# Patient Record
Sex: Male | Born: 1950 | Race: White | Hispanic: No | Marital: Married | Smoking: Current every day smoker
Health system: Southern US, Community
[De-identification: ages and names within clinical notes are randomized; demographics above are authoritative.]

## PROBLEM LIST (undated history)

## (undated) DIAGNOSIS — R112 Nausea with vomiting, unspecified: Secondary | ICD-10-CM

## (undated) DIAGNOSIS — N411 Chronic prostatitis: Secondary | ICD-10-CM

## (undated) DIAGNOSIS — Z87442 Personal history of urinary calculi: Secondary | ICD-10-CM

## (undated) DIAGNOSIS — Z9889 Other specified postprocedural states: Secondary | ICD-10-CM

## (undated) DIAGNOSIS — M1990 Unspecified osteoarthritis, unspecified site: Secondary | ICD-10-CM

## (undated) DIAGNOSIS — K219 Gastro-esophageal reflux disease without esophagitis: Secondary | ICD-10-CM

## (undated) HISTORY — PX: KNEE ARTHROSCOPY: SUR90

## (undated) HISTORY — PX: COLONOSCOPY: SHX174

## (undated) HISTORY — PX: NOSE SURGERY: SHX723

## (undated) HISTORY — PX: EYE SURGERY: SHX253

## (undated) HISTORY — PX: HERNIA REPAIR: SHX51

---

## 2005-09-22 ENCOUNTER — Ambulatory Visit: Payer: Self-pay | Admitting: Gastroenterology

## 2008-04-23 ENCOUNTER — Ambulatory Visit: Payer: Self-pay | Admitting: Family Medicine

## 2008-12-15 ENCOUNTER — Ambulatory Visit: Payer: Self-pay | Admitting: Unknown Physician Specialty

## 2009-04-14 ENCOUNTER — Ambulatory Visit: Payer: Self-pay | Admitting: Orthopedic Surgery

## 2009-04-21 ENCOUNTER — Ambulatory Visit: Payer: Self-pay | Admitting: Orthopedic Surgery

## 2009-04-28 ENCOUNTER — Encounter: Payer: Self-pay | Admitting: Orthopedic Surgery

## 2009-04-29 ENCOUNTER — Encounter: Payer: Self-pay | Admitting: Orthopedic Surgery

## 2010-02-08 ENCOUNTER — Ambulatory Visit: Payer: Self-pay | Admitting: Unknown Physician Specialty

## 2011-01-19 ENCOUNTER — Ambulatory Visit: Payer: Self-pay | Admitting: Urology

## 2011-01-27 ENCOUNTER — Ambulatory Visit: Payer: Self-pay | Admitting: Urology

## 2012-01-30 ENCOUNTER — Ambulatory Visit: Payer: Self-pay

## 2012-02-25 ENCOUNTER — Emergency Department: Payer: Self-pay | Admitting: Emergency Medicine

## 2012-02-25 LAB — APTT: Activated PTT: 29 secs (ref 23.6–35.9)

## 2012-02-25 LAB — COMPREHENSIVE METABOLIC PANEL
Albumin: 4.3 g/dL (ref 3.4–5.0)
Alkaline Phosphatase: 146 U/L — ABNORMAL HIGH (ref 50–136)
Anion Gap: 11 (ref 7–16)
BUN: 15 mg/dL (ref 7–18)
Bilirubin,Total: 1.2 mg/dL — ABNORMAL HIGH (ref 0.2–1.0)
Chloride: 103 mmol/L (ref 98–107)
Co2: 23 mmol/L (ref 21–32)
EGFR (African American): >60
EGFR (Non-African Amer.): >60
Glucose: 128 mg/dL — ABNORMAL HIGH (ref 65–99)
SGPT (ALT): 36 U/L
Sodium: 137 mmol/L (ref 136–145)
Total Protein: 7.5 g/dL (ref 6.4–8.2)

## 2012-02-25 LAB — PROTIME-INR
INR: 1.1
Prothrombin Time: 15 secs — ABNORMAL HIGH (ref 11.5–14.7)

## 2012-02-25 LAB — CBC
HCT: 44.6 % (ref 40.0–52.0)
HGB: 15.3 g/dL (ref 13.0–18.0)
MCHC: 34.4 g/dL (ref 32.0–36.0)
MCV: 100 fL (ref 80–100)
Platelet: 187 10*3/uL (ref 150–440)

## 2012-02-25 LAB — DIFFERENTIAL
Basophil #: 0.3 10*3/uL — ABNORMAL HIGH (ref 0.0–0.1)
Basophil %: 1.1 %
Eosinophil #: 0 10*3/uL (ref 0.0–0.7)
Eosinophil %: 0.1 %
Lymphocyte #: 1.8 10*3/uL (ref 1.0–3.6)
Monocyte #: 0.9 x10 3/mm (ref 0.2–1.0)
Neutrophil #: 25.5 10*3/uL — ABNORMAL HIGH (ref 1.4–6.5)
Neutrophil %: 89.3 %

## 2013-03-07 ENCOUNTER — Ambulatory Visit: Payer: Self-pay | Admitting: Unknown Physician Specialty

## 2013-03-11 LAB — PATHOLOGY REPORT

## 2013-06-19 IMAGING — CT CT THORACIC SPINE WITHOUT CONTRAST
1 series · 12 of 14 positions shown, 15 images · non-contrast
Comparison: none

REASON FOR EXAM: fall
COMMENTS:

PROCEDURE:     CT  - CT THORACIC SPINE WO  - February 25, 2012  [DATE]
RESULT:     Comparison: Chest radiograph performed same day as well as
04/23/2008
TECHNIQUE: Multiple axial images were obtained of the thoracic spine,
without intravenous contrast.

[Series 3: axials · axial · 0.29mm/px · z∈[-228,+58]mm · 12 of 113 slices shown, 15 images]
[im 9/113  soft-tissue]
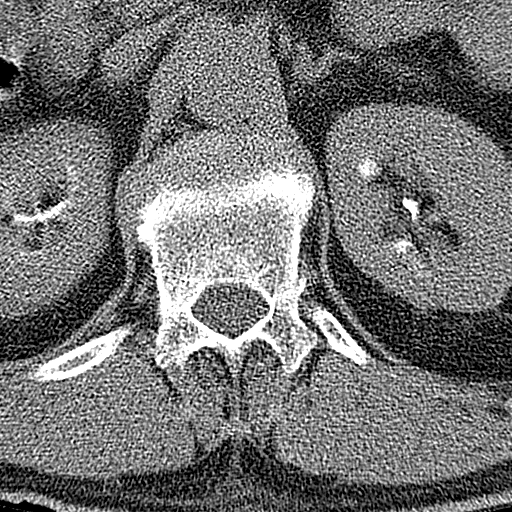
[im 9/113  bone]
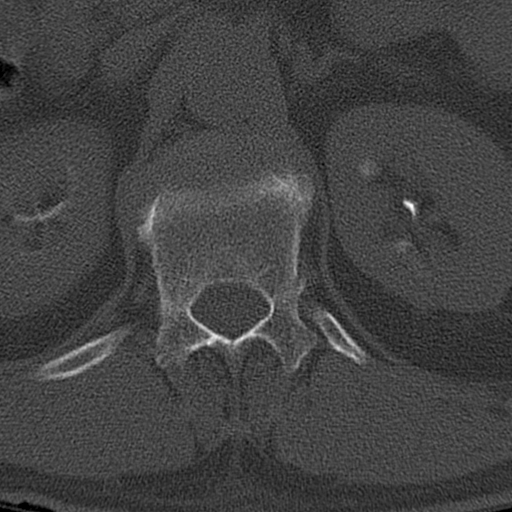
[im 18/113  bone]
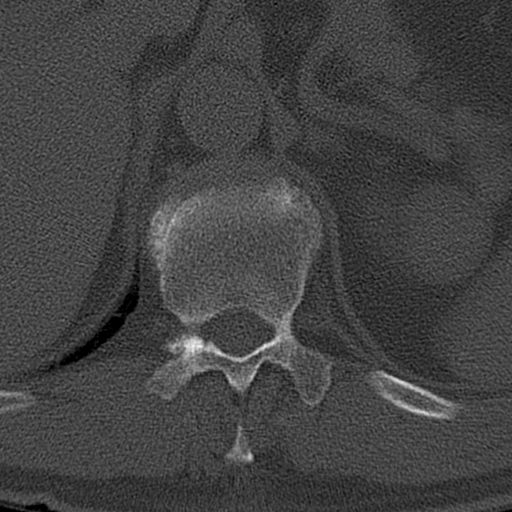
[im 26/113  bone]
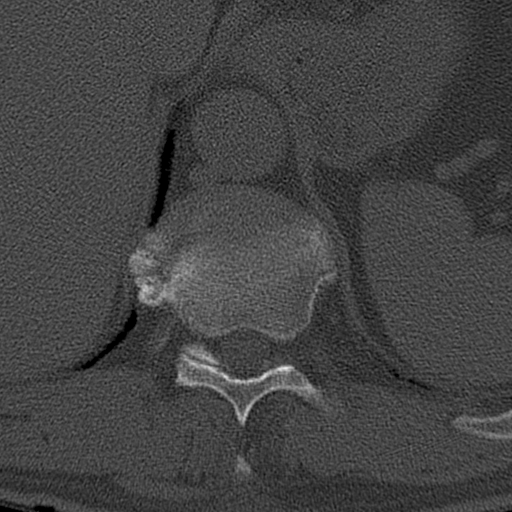
[im 35/113  bone]
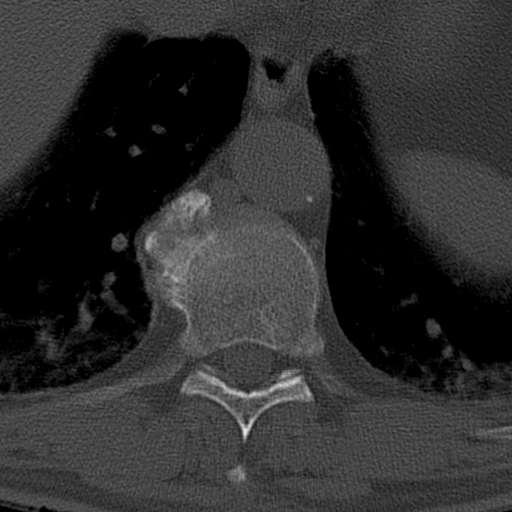
[im 44/113  soft-tissue]
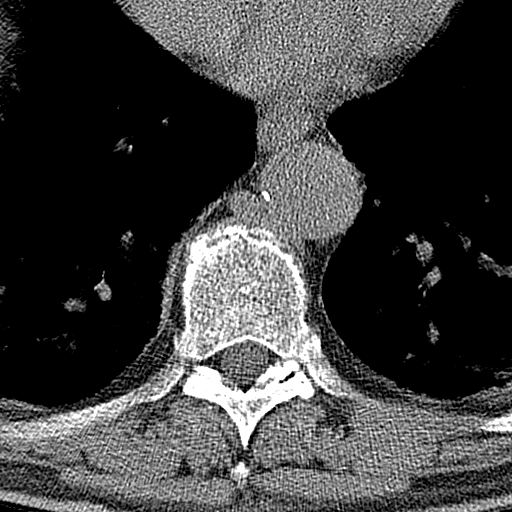
[im 44/113  bone]
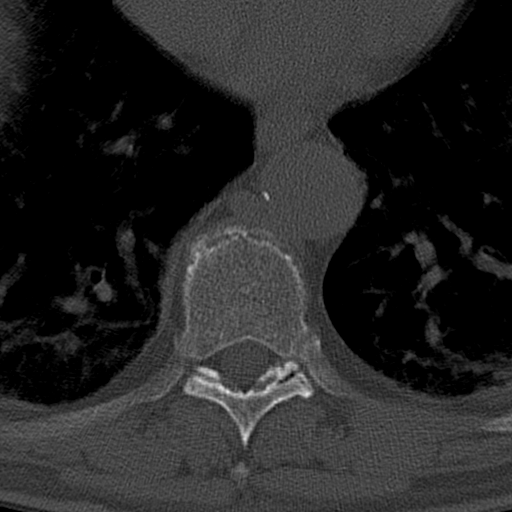
[im 52/113  bone]
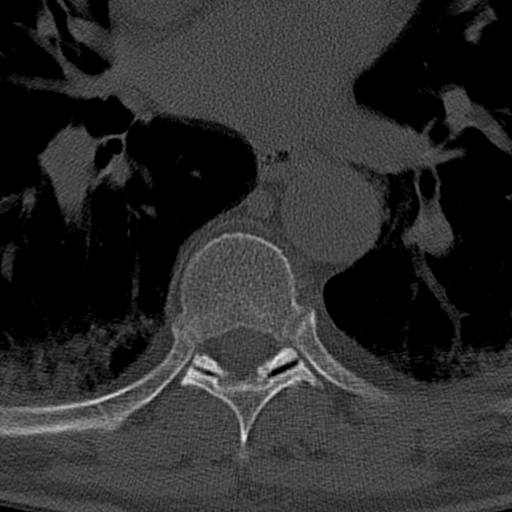
[im 61/113  bone]
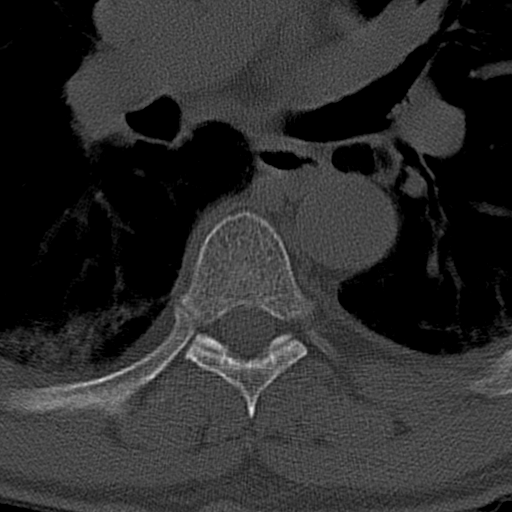
[im 69/113  bone]
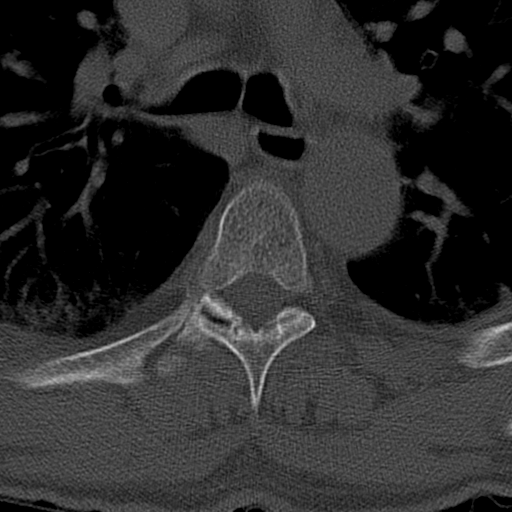
[im 78/113  soft-tissue]
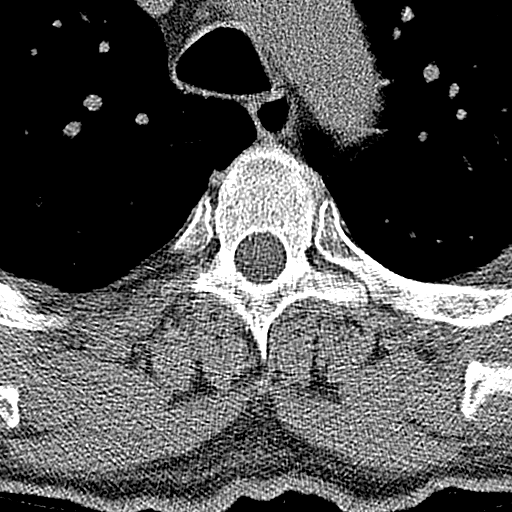
[im 78/113  bone]
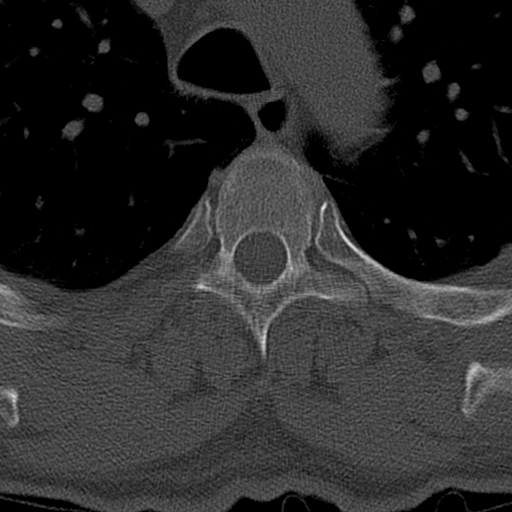
[im 87/113  bone]
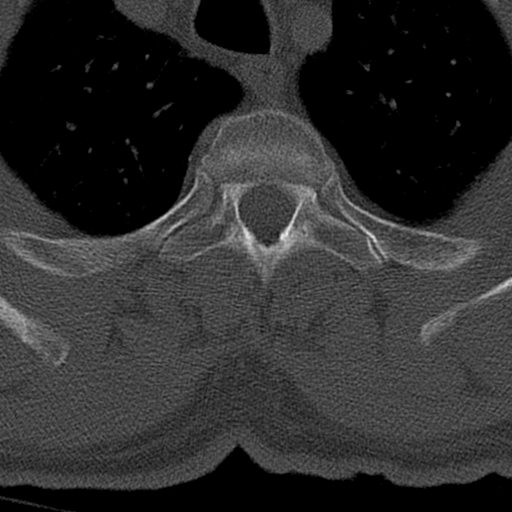
[im 95/113  bone]
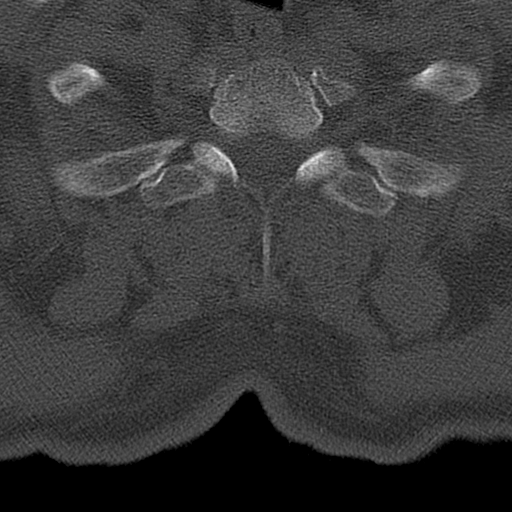
[im 104/113  bone]
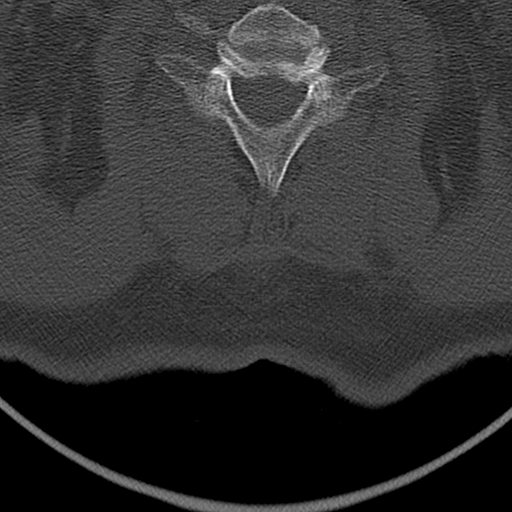

[12 of 14 positions shown; findings below may reference images not displayed]

FINDINGS: There is a mild to moderate anterior wedge compression deformity of the T12
vertebral body. While this is technically age indeterminate, there is some
linear density just inferior to the superior endplate as well as some
lucency in the anterior osteophyte. In comparing the chest radiograph of
today to 04/23/2008 this height loss may be slightly increased from prior.
Findings raise the possibility of an acute on chronic fracture. Mild
anterior height loss of the T8, T9, and T10 vertebral bodies appears
chronic, and similar to the prior chest radiograph in 9110.

Mild opacities in the dependent lower lobes are likely secondary to
atelectasis. Infection or aspiration are not excluded.
IMPRESSION: 1. Mild to moderate anterior wedge compression deformity of the T12
vertebral body is technically age indeterminate. However, there are findings
that suggest there is an acute on chronic fracture of this vertebral body.
Correlate with patient's history and site of pain.
2. Mild anterior height loss of several other vertebral bodies in the lower
thoracic spine appear chronic and similar to the prior chest radiograph,
given differences in technique.

[REDACTED]

## 2013-06-19 IMAGING — CT CT MAXILLOFACIAL WITHOUT CONTRAST
1 series · 15 of 30 positions shown, 19 images · non-contrast
Comparison: none

REASON FOR EXAM: s/p fall
COMMENTS:

PROCEDURE:     CT  - CT MAXILLOFACIAL AREA WO  - February 25, 2012  [DATE]
RESULT:     Comparison: None.
TECHNIQUE: Multiple axial images were obtained of the face, without
intravenous contrast. Coronal reformats were performed.

[Series 2: facial 3.0 h60f · axial · 0.34mm/px · z∈[-94,+68]mm · 15 of 60 slices shown, 19 images]
[im 3/60  brain]
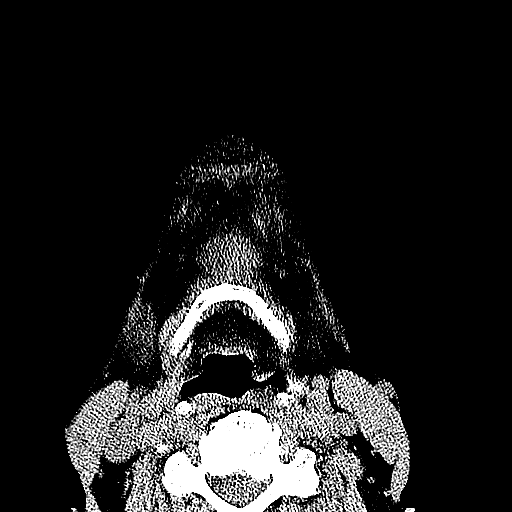
[im 3/60  bone]
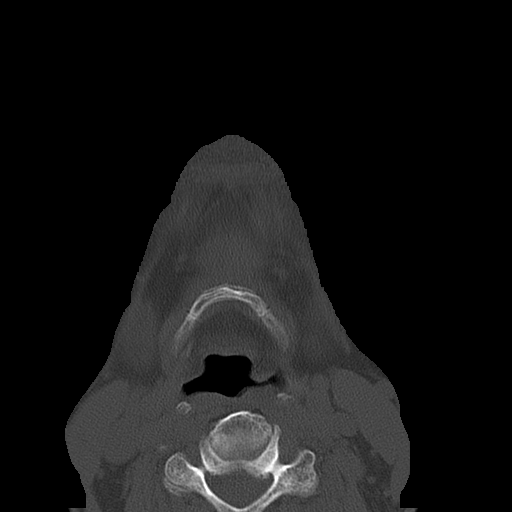
[im 7/60  bone]
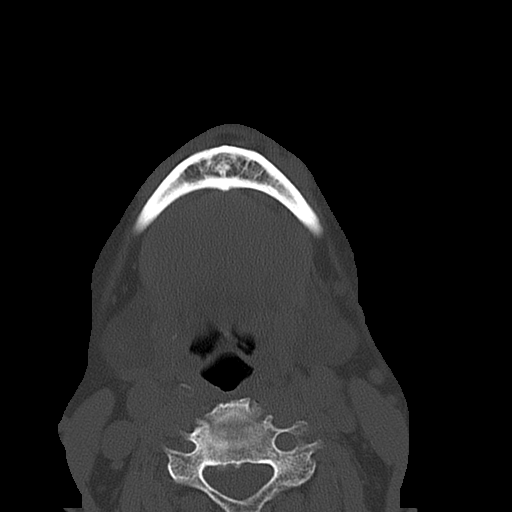
[im 11/60  bone]
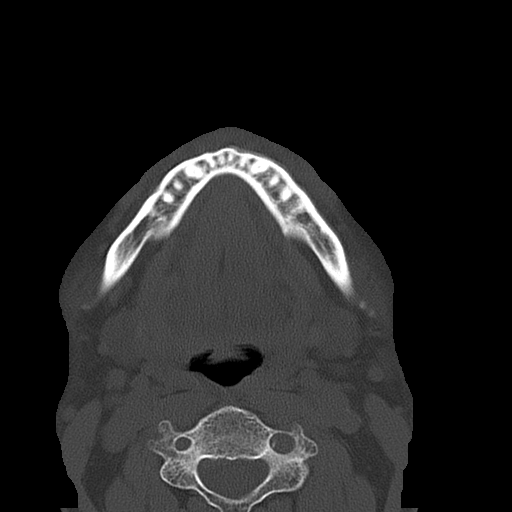
[im 15/60  bone]
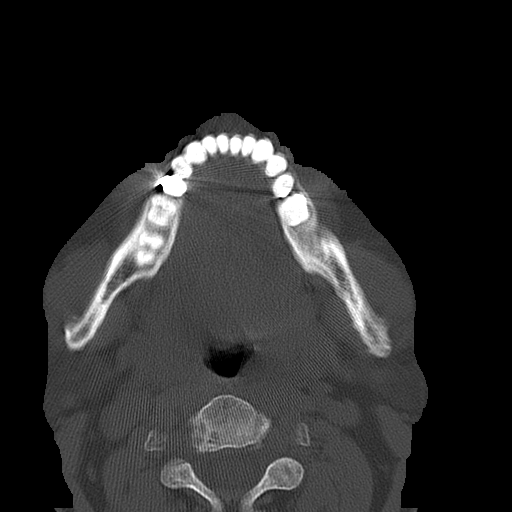
[im 19/60  brain]
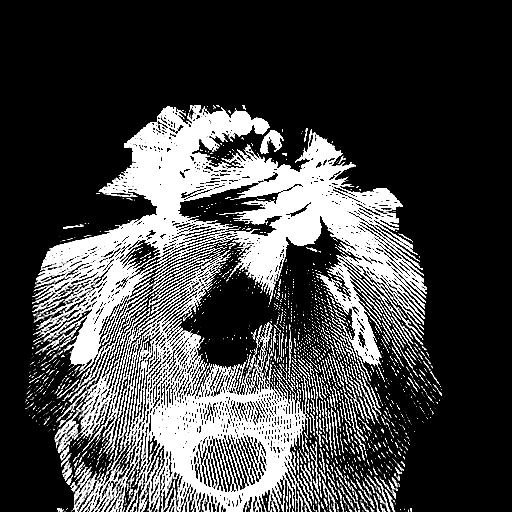
[im 19/60  bone]
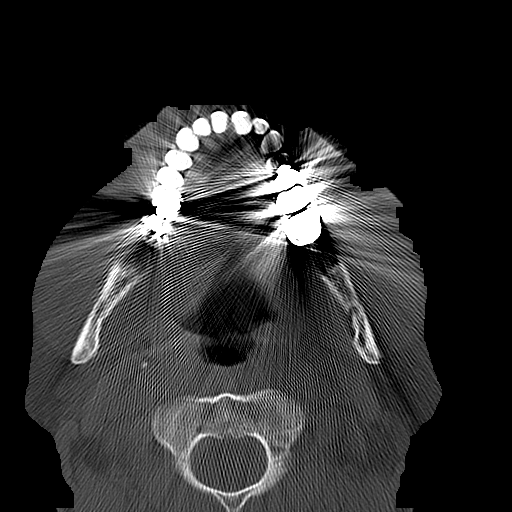
[im 23/60  bone]
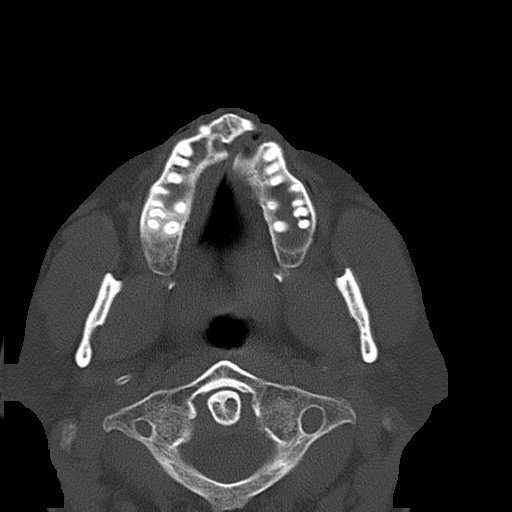
[im 27/60  bone]
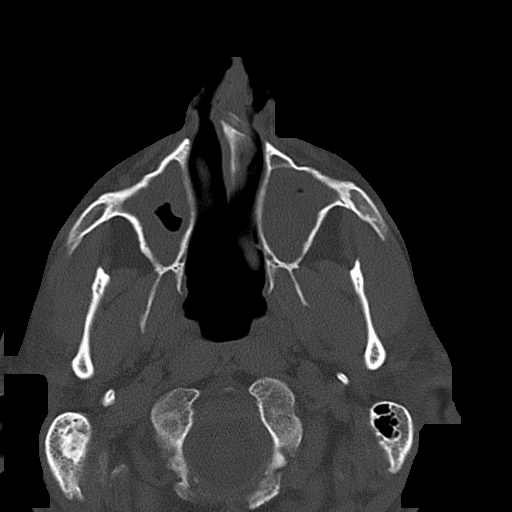
[im 31/60  bone]
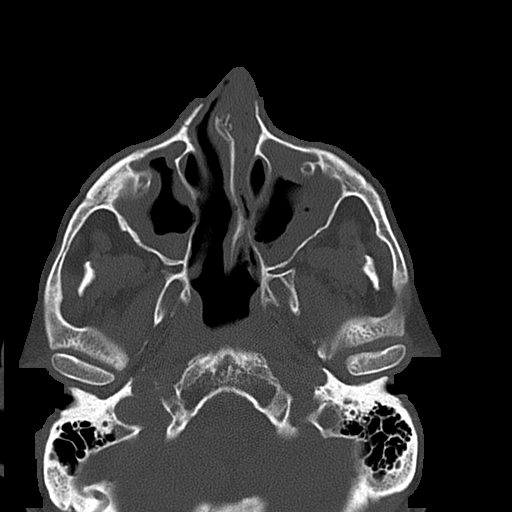
[im 33/60  brain]
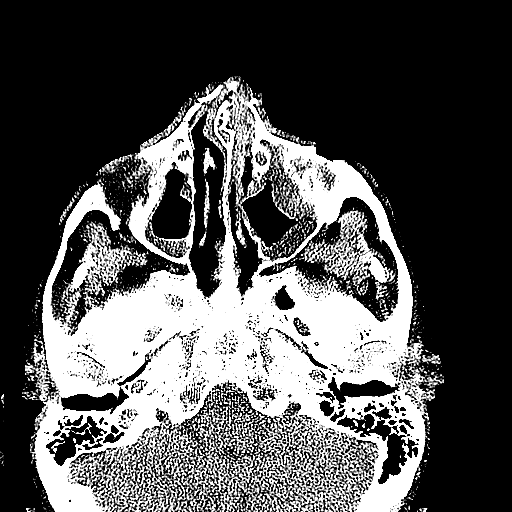
[im 33/60  bone]
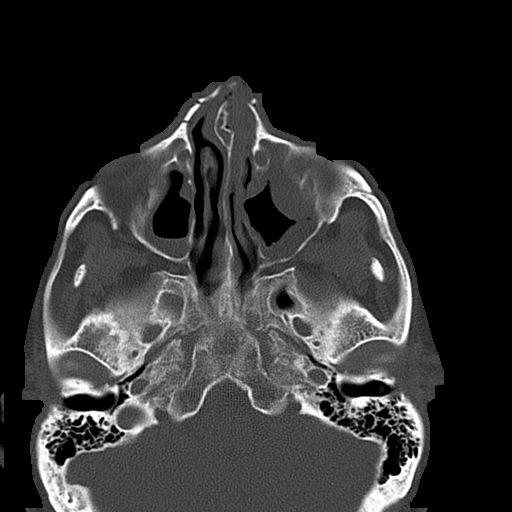
[im 37/60  bone]
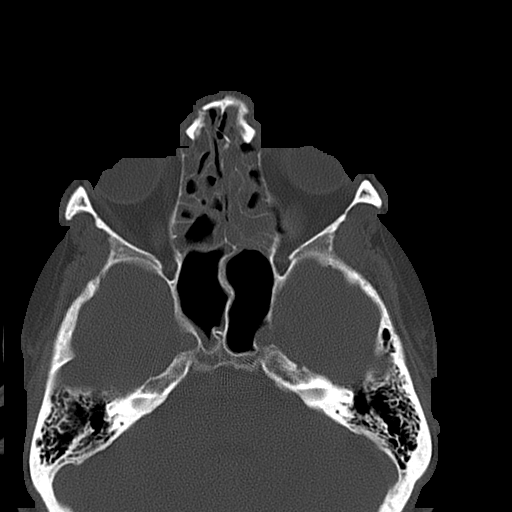
[im 41/60  bone]
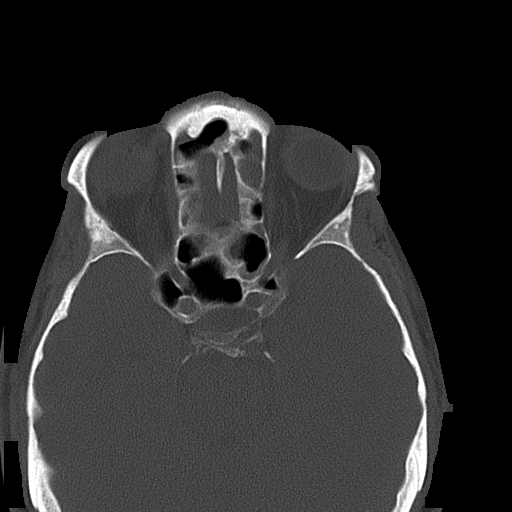
[im 45/60  bone]
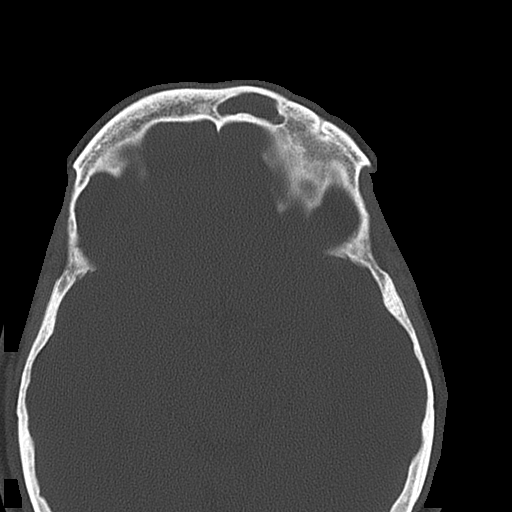
[im 49/60  brain]
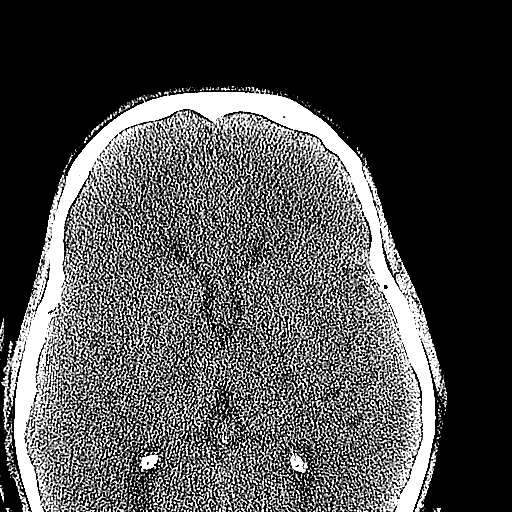
[im 49/60  bone]
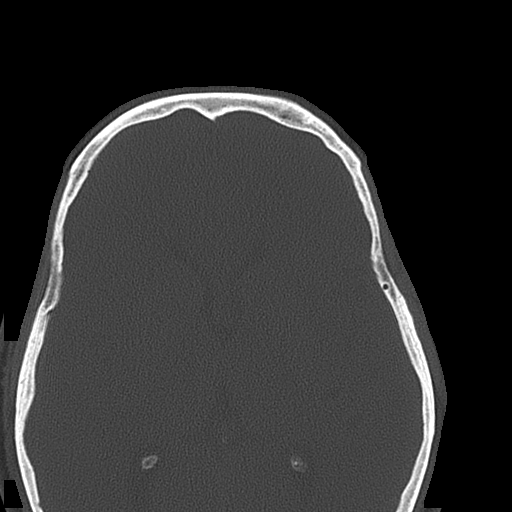
[im 53/60  bone]
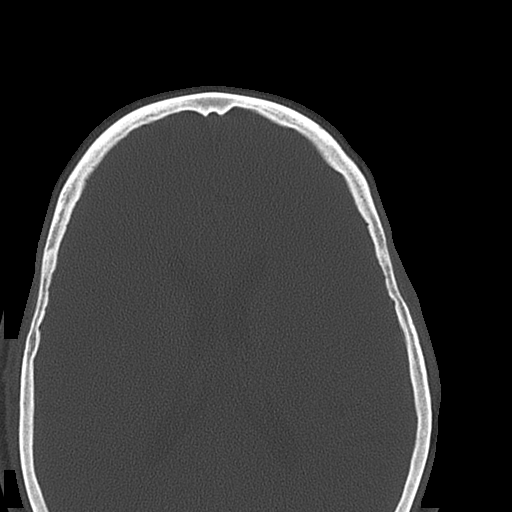
[im 57/60  bone]
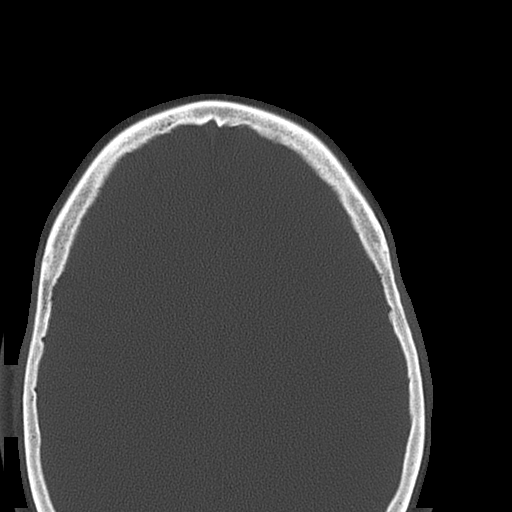

[15 of 30 positions shown; findings below may reference images not displayed]

FINDINGS: There are multiple displaced fractures of the nasal bones. There is
displaced fracture of the nasal septum. There is medial buckling of the
medial walls of the bilateral maxillary sinuses, which may represent
fractures. There is a fracture of the left anterior maxilla, which extends
into the hard palate. There is moderate opacification of the ethmoid air
cells, maxillary sinuses, and frontal sinuses. The globes are intact. There
is mild mucosal thickening of the right sphenoid sinus.

Flattening of the mandibular condyles is likely secondary to degenerative
change.
IMPRESSION: 1. Comminuted fractures of the nasal bones as well as fracture of the nasal
septum. There is a fracture of the left anterior maxilla, which extends into
the hard palate.
2. Mild medial buckling of the medial walls of the maxillary sinuses may be
secondary to fractures.

[REDACTED]

## 2013-06-19 IMAGING — CT CT ABDOMEN W/ CM
1 of 2 series · 14 of 32 positions shown, 19 images · IV contrast (isovue)
Comparison: none

REASON FOR EXAM: RUQ paIN BLUNT TRAUMA
COMMENTS:

PROCEDURE:     CT  - CT ABDOMEN STANDARD W  - February 25, 2012  [DATE]
RESULT:     Comparison: None
TECHNIQUE: Multiple axial images of the abdomen were performed from the lung
bases to the iliac crests, without p.o. contrast and with 100 mL of Isovue
300 intravenous contrast.

[Series 2: 3mm soft tissue · axial · 0.68mm/px · z∈[-373,-49]mm · 14 of 120 slices shown, 19 images]
[im 6/120  soft-tissue]
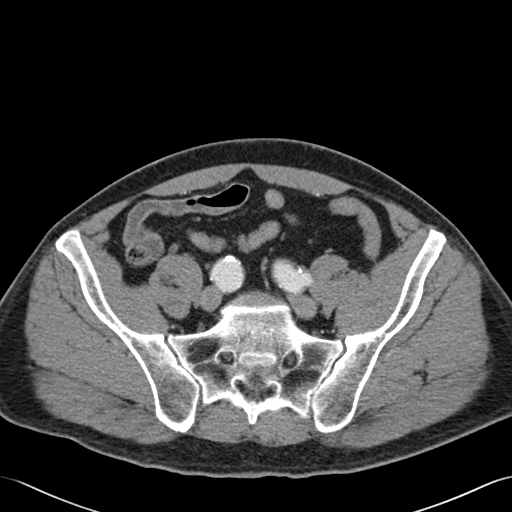
[im 6/120  bone]
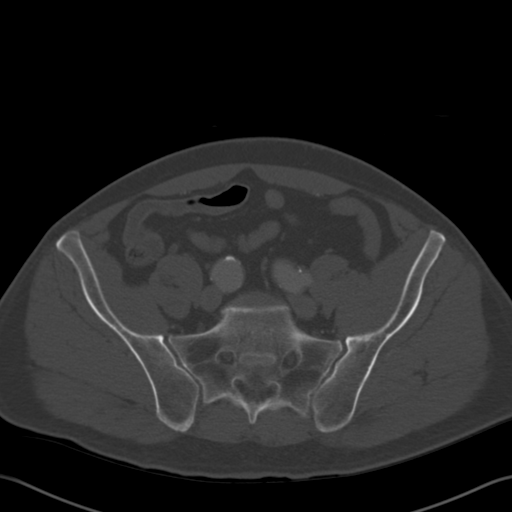
[im 18/120  soft-tissue]
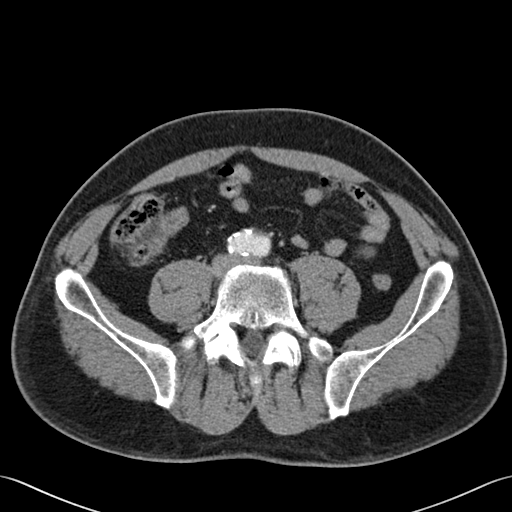
[im 23/120  soft-tissue]
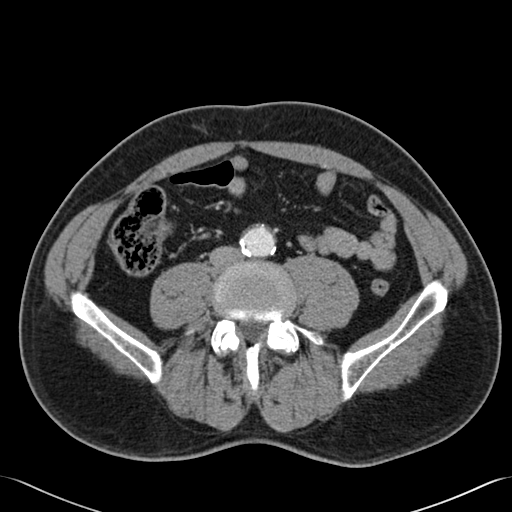
[im 35/120  soft-tissue]
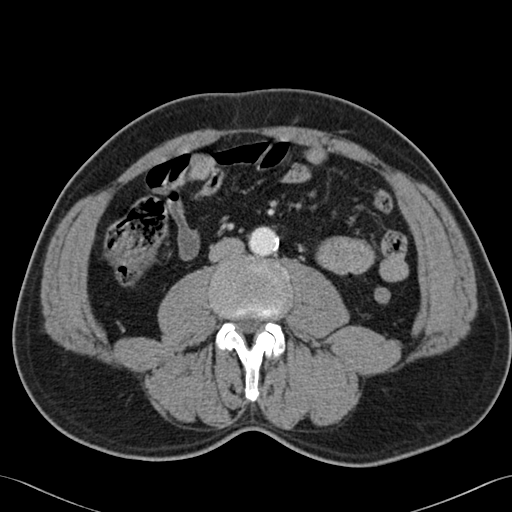
[im 40/120  soft-tissue]
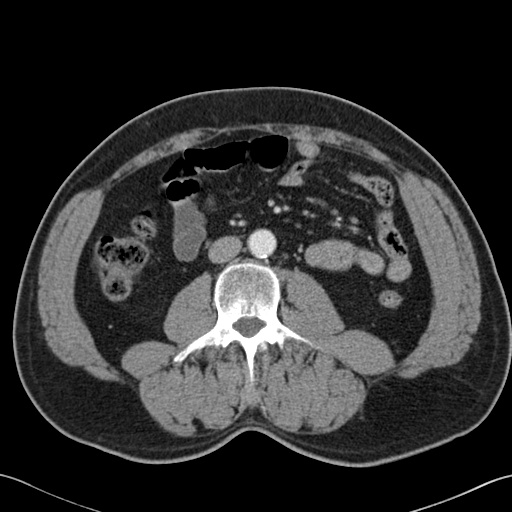
[im 52/120  soft-tissue]
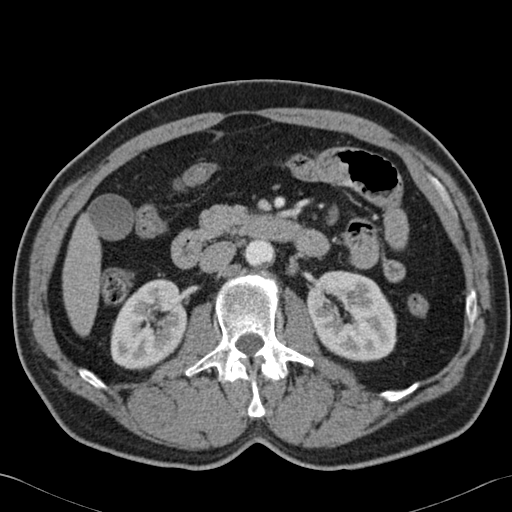
[im 63/120  soft-tissue]
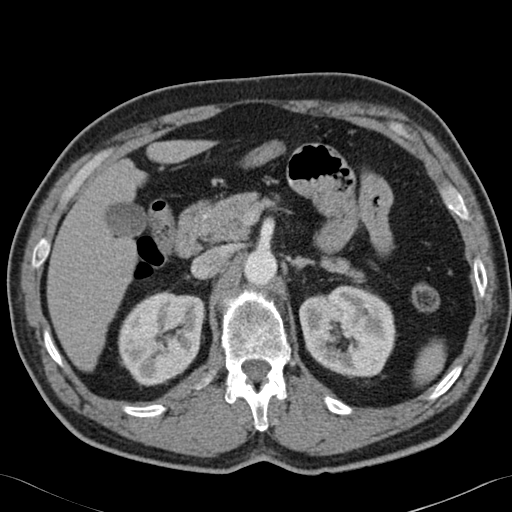
[im 69/120  soft-tissue]
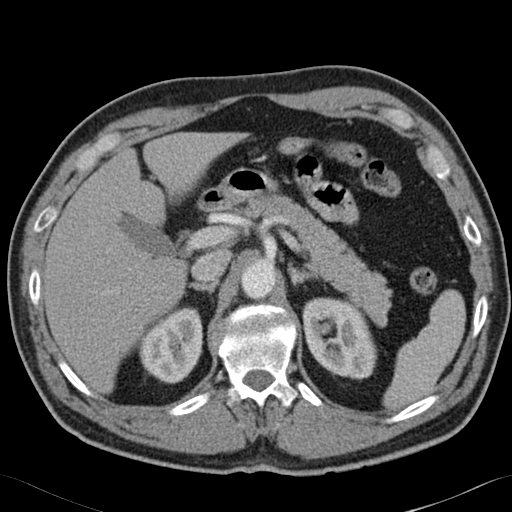
[im 80/120  soft-tissue]
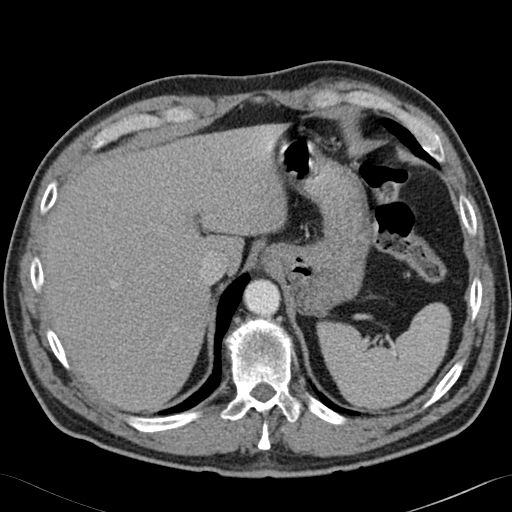
[im 80/120  bone]
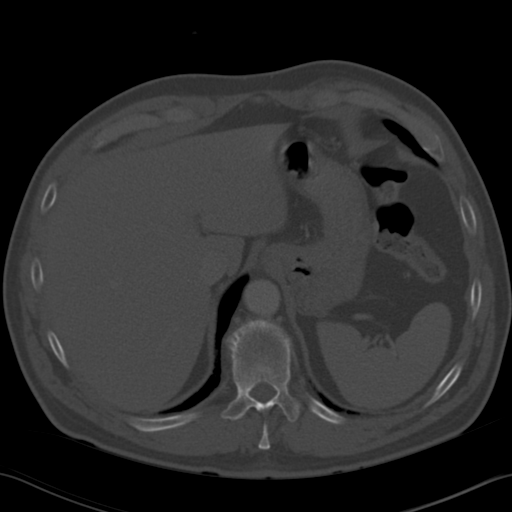
[im 86/120  soft-tissue]
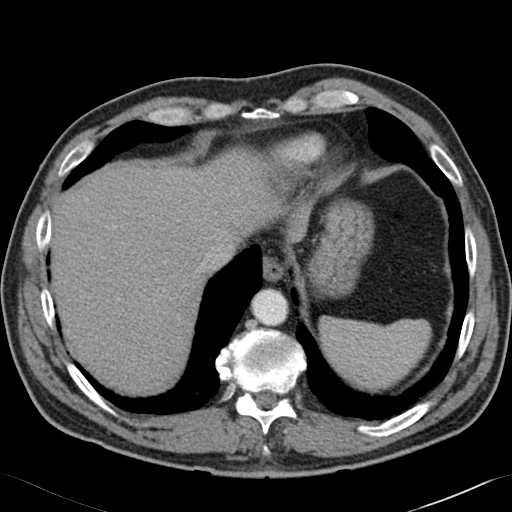
[im 97/120  soft-tissue]
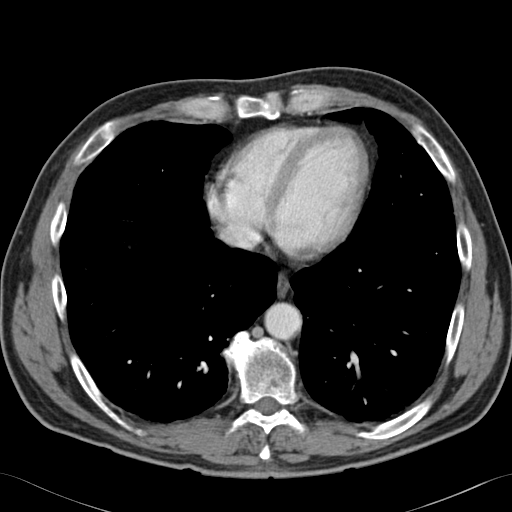
[im 97/120  lung]
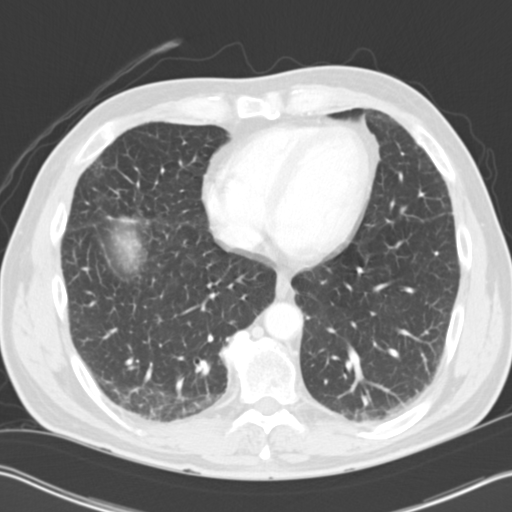
[im 103/120  soft-tissue]
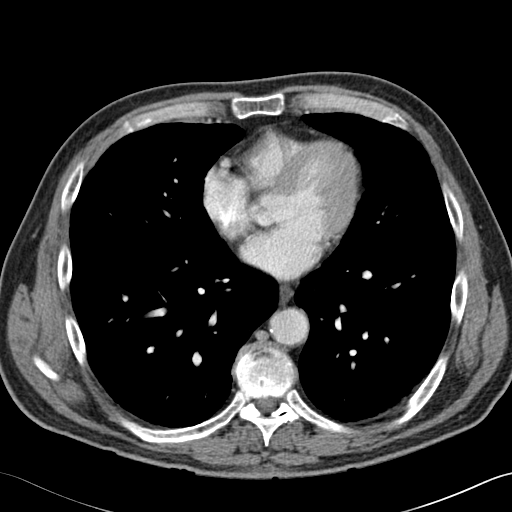
[im 103/120  lung]
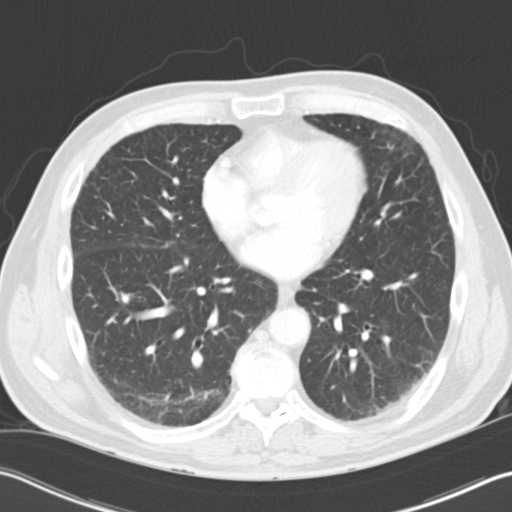
[im 108/120  lung]
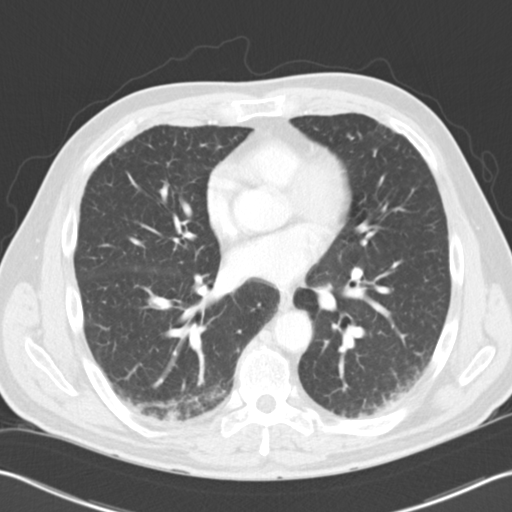
[im 114/120  soft-tissue]
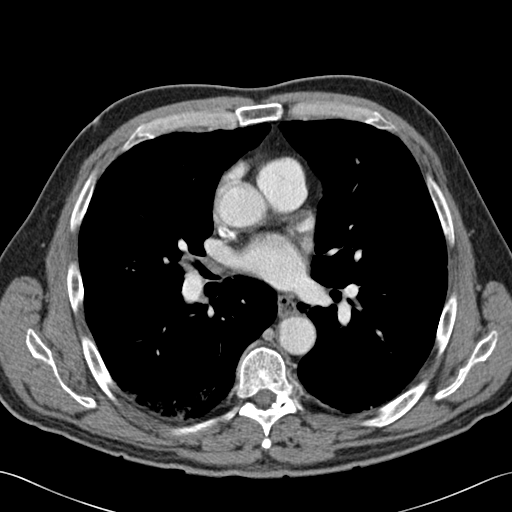
[im 114/120  lung]
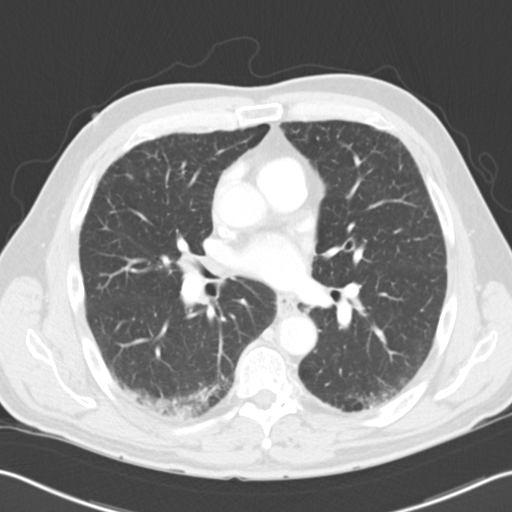

[14 of 32 positions shown; findings below may reference images not displayed]

FINDINGS: Mild basilar opacities are likely secondary to atelectasis.

Small low-attenuation lesion in the left hepatic lobe is too small to
characterize. The liver is otherwise unremarkable. The gallbladder, spleen,
adrenals, and pancreas are unremarkable. The kidneys enhance normally. Small
attenuation lesion in the right kidney is too small to characterize. The
visualized small and large bowel are normal in caliber. The appendix is
normal. The iliac arteries are mildly enlarged, right greater than left. The
right iliac artery measures 2.1 cm in diameter.

There are possible nondisplaced fractures of the anterior right sixth,
seventh, and eighth ribs. There is a mild anterior wedge compression
deformity of the T12 vertebral body which appears chronic and relatively
similar to chest radiograph 04/23/2008.
IMPRESSION: 1. Possible nondisplaced fractures of the right anterior sixth, seventh, and
eighth ribs.
2. No acute findings within the abdomen.
3. Mild anterior wedge compression deformity of the T12 vertebral body
appears chronic and relatively similar to prior chest radiograph. However,
correlate with patient's site of pain and clinical history.
4. The common iliac arteries are mildly enlarged.

[REDACTED]

## 2014-03-29 HISTORY — PX: CATARACT EXTRACTION W/ INTRAOCULAR LENS  IMPLANT, BILATERAL: SHX1307

## 2014-08-06 HISTORY — PX: CHOLECYSTECTOMY: SHX55

## 2015-10-08 HISTORY — PX: APPENDECTOMY: SHX54

## 2017-08-28 ENCOUNTER — Other Ambulatory Visit: Payer: Self-pay | Admitting: Specialist

## 2017-08-28 DIAGNOSIS — M25512 Pain in left shoulder: Secondary | ICD-10-CM

## 2017-09-05 ENCOUNTER — Ambulatory Visit
Admission: RE | Admit: 2017-09-05 | Discharge: 2017-09-05 | Disposition: A | Discharge status: Home / Self Care | Payer: BLUE CROSS/BLUE SHIELD | Source: Ambulatory Visit | Attending: Specialist | Admitting: Specialist

## 2017-09-05 DIAGNOSIS — M25512 Pain in left shoulder: Secondary | ICD-10-CM

## 2017-09-05 DIAGNOSIS — M19012 Primary osteoarthritis, left shoulder: Secondary | ICD-10-CM | POA: Insufficient documentation

## 2017-10-04 ENCOUNTER — Other Ambulatory Visit: Payer: Self-pay | Admitting: Specialist

## 2017-10-09 ENCOUNTER — Encounter
Admission: RE | Admit: 2017-10-09 | Discharge: 2017-10-09 | Disposition: A | Discharge status: Home / Self Care | Payer: BLUE CROSS/BLUE SHIELD | Source: Ambulatory Visit | Attending: Specialist | Admitting: Specialist

## 2017-10-09 ENCOUNTER — Other Ambulatory Visit: Payer: Self-pay

## 2017-10-09 DIAGNOSIS — Z01812 Encounter for preprocedural laboratory examination: Secondary | ICD-10-CM | POA: Insufficient documentation

## 2017-10-09 DIAGNOSIS — M25512 Pain in left shoulder: Secondary | ICD-10-CM | POA: Insufficient documentation

## 2017-10-09 HISTORY — DX: Chronic prostatitis: N41.1

## 2017-10-09 HISTORY — DX: Other specified postprocedural states: Z98.890

## 2017-10-09 HISTORY — DX: Personal history of urinary calculi: Z87.442

## 2017-10-09 HISTORY — DX: Unspecified osteoarthritis, unspecified site: M19.90

## 2017-10-09 HISTORY — DX: Gastro-esophageal reflux disease without esophagitis: K21.9

## 2017-10-09 HISTORY — DX: Nausea with vomiting, unspecified: R11.2

## 2017-10-09 LAB — CBC
HEMATOCRIT: 44.8 % (ref 40.0–52.0)
HEMOGLOBIN: 15.3 g/dL (ref 13.0–18.0)
MCH: 34.8 pg — ABNORMAL HIGH (ref 26.0–34.0)
MCHC: 34.2 g/dL (ref 32.0–36.0)
MCV: 101.7 fL — AB (ref 80.0–100.0)
Platelets: 196 10*3/uL (ref 150–440)
RBC: 4.4 MIL/uL (ref 4.40–5.90)
RDW: 13.2 % (ref 11.5–14.5)
WBC: 6.6 10*3/uL (ref 3.8–10.6)

## 2017-10-09 NOTE — Patient Instructions (Signed)
Your procedure is scheduled on: Monday, October 16, 2017 Report to Same Day Surgery on the 2nd floor in the [facility]. To find out your arrival time, please call [phone] between 1PM - 3PM on: Friday, October 13, 2017  REMEMBER: Instructions that are not followed completely may result in serious medical risk, up to and including death; or upon the discretion of your surgeon and anesthesiologist your surgery may need to be rescheduled.  Do not eat food after midnight the night before your procedure.  No gum chewing or hard candies.  You may however, drink CLEAR liquids up to 2 hours before you are scheduled to arrive at the hospital for your procedure.  Do not drink clear liquids within 2 hours of the start of your surgery.  Clear liquids include: - water  - apple juice without pulp - clear gatorade - black coffee or tea (Do NOT add anything to the coffee or tea) Do NOT drink anything that is not on this list.  No Alcohol for 24 hours before or after surgery.  No Smoking including e-cigarettes for 24 hours prior to surgery. No chewable tobacco products for at least 6 hours prior to surgery. No nicotine patches on the day of surgery.  On the morning of surgery brush your teeth with toothpaste and water, you may rinse your mouth with mouthwash if you wish. Do not swallow any  toothpaste of mouthwash.  Notify your doctor if there is any change in your medical condition (cold, fever, infection).  Do not wear jewelry, make-up, hairpins, clips or nail polish.  Do not wear lotions, powders, or perfumes. You may wear deodorant.  Do not shave 48 hours prior to surgery. Men may shave face and neck.  Contacts and dentures may not be worn into surgery.  Do not bring valuables to the hospital. [facility] belongings or valuables.  TAKE THESE MEDICATIONS THE MORNING OF SURGERY:  NONE  Use CHG Soap as directed on instruction sheet.  NOW!  Stop ASPIRIN  and Anti-inflammatories such as Advil, Aleve, Ibuprofen, Motrin, Naproxen, Naprosyn, Goodie powder, or aspirin products. (May take Tylenol or Acetaminophen if needed.)  NOW!  Stop ANY OVER THE COUNTER supplements until after surgery.   If you are being discharged the day of surgery, you will not be allowed to drive home. You will need someone to drive you home and stay with you that night.   If you are taking public transportation, you will need to have a responsible adult to with you.  Please call the number above if you have any questions about these instructions.

## 2017-10-15 MED ORDER — CLINDAMYCIN PHOSPHATE 600 MG/50ML IV SOLN
600.0000 mg | INTRAVENOUS | Status: AC
  Administered 2017-10-16: 600 mg via INTRAVENOUS

## 2017-10-16 ENCOUNTER — Encounter: Payer: Self-pay | Admitting: Anesthesiology

## 2017-10-16 ENCOUNTER — Ambulatory Visit: Payer: BLUE CROSS/BLUE SHIELD | Admitting: Anesthesiology

## 2017-10-16 ENCOUNTER — Encounter
Admission: RE | Disposition: A | Discharge status: Home / Self Care | Payer: Self-pay | Source: Ambulatory Visit | Attending: Specialist

## 2017-10-16 ENCOUNTER — Ambulatory Visit
Admission: RE | Admit: 2017-10-16 | Discharge: 2017-10-16 | Disposition: A | Discharge status: Home / Self Care | Payer: BLUE CROSS/BLUE SHIELD | Source: Ambulatory Visit | Attending: Specialist | Admitting: Specialist

## 2017-10-16 DIAGNOSIS — M1990 Unspecified osteoarthritis, unspecified site: Secondary | ICD-10-CM | POA: Diagnosis not present

## 2017-10-16 DIAGNOSIS — Z87442 Personal history of urinary calculi: Secondary | ICD-10-CM | POA: Diagnosis not present

## 2017-10-16 DIAGNOSIS — Z9049 Acquired absence of other specified parts of digestive tract: Secondary | ICD-10-CM | POA: Insufficient documentation

## 2017-10-16 DIAGNOSIS — Z7982 Long term (current) use of aspirin: Secondary | ICD-10-CM | POA: Diagnosis not present

## 2017-10-16 DIAGNOSIS — Z791 Long term (current) use of non-steroidal anti-inflammatories (NSAID): Secondary | ICD-10-CM | POA: Insufficient documentation

## 2017-10-16 DIAGNOSIS — F17200 Nicotine dependence, unspecified, uncomplicated: Secondary | ICD-10-CM | POA: Diagnosis not present

## 2017-10-16 DIAGNOSIS — Z961 Presence of intraocular lens: Secondary | ICD-10-CM | POA: Insufficient documentation

## 2017-10-16 DIAGNOSIS — M7552 Bursitis of left shoulder: Secondary | ICD-10-CM | POA: Diagnosis not present

## 2017-10-16 DIAGNOSIS — Z9889 Other specified postprocedural states: Secondary | ICD-10-CM | POA: Insufficient documentation

## 2017-10-16 DIAGNOSIS — Z9841 Cataract extraction status, right eye: Secondary | ICD-10-CM | POA: Insufficient documentation

## 2017-10-16 DIAGNOSIS — M7542 Impingement syndrome of left shoulder: Secondary | ICD-10-CM | POA: Insufficient documentation

## 2017-10-16 DIAGNOSIS — Z9842 Cataract extraction status, left eye: Secondary | ICD-10-CM | POA: Diagnosis not present

## 2017-10-16 DIAGNOSIS — M7522 Bicipital tendinitis, left shoulder: Secondary | ICD-10-CM | POA: Diagnosis not present

## 2017-10-16 HISTORY — PX: SHOULDER ARTHROSCOPY WITH SUBACROMIAL DECOMPRESSION: SHX5684

## 2017-10-16 SURGERY — SHOULDER ARTHROSCOPY WITH SUBACROMIAL DECOMPRESSION
Anesthesia: General | Laterality: Left

## 2017-10-16 MED ORDER — LIDOCAINE HCL (PF) 1 % IJ SOLN
INTRAMUSCULAR | Status: AC
  Filled 2017-10-16: qty 5

## 2017-10-16 MED ORDER — EPINEPHRINE PF 1 MG/ML IJ SOLN
INTRAMUSCULAR | Status: DC | PRN
  Administered 2017-10-16: 6 mg

## 2017-10-16 MED ORDER — CHLORHEXIDINE GLUCONATE CLOTH 2 % EX PADS: 6.0000 | MEDICATED_PAD | Freq: Once | CUTANEOUS | Status: DC

## 2017-10-16 MED ORDER — ONDANSETRON HCL 4 MG/2ML IJ SOLN
INTRAMUSCULAR | Status: AC
  Administered 2017-10-16: 4 mg via INTRAVENOUS
  Filled 2017-10-16: qty 2

## 2017-10-16 MED ORDER — EPINEPHRINE 30 MG/30ML IJ SOLN
INTRAMUSCULAR | Status: AC
  Filled 2017-10-16: qty 1

## 2017-10-16 MED ORDER — NEOMYCIN-POLYMYXIN B GU 40-200000 IR SOLN
Status: AC
  Filled 2017-10-16: qty 2

## 2017-10-16 MED ORDER — DEXAMETHASONE SODIUM PHOSPHATE 10 MG/ML IJ SOLN
INTRAMUSCULAR | Status: DC | PRN
  Administered 2017-10-16: 10 mg via INTRAVENOUS

## 2017-10-16 MED ORDER — BUPIVACAINE-EPINEPHRINE (PF) 0.25% -1:200000 IJ SOLN
INTRAMUSCULAR | Status: DC | PRN
  Administered 2017-10-16: 30 mL
  Administered 2017-10-16: 20 mL

## 2017-10-16 MED ORDER — FENTANYL CITRATE (PF) 100 MCG/2ML IJ SOLN: 25.0000 ug | INTRAMUSCULAR | Status: DC | PRN

## 2017-10-16 MED ORDER — BUPIVACAINE-EPINEPHRINE (PF) 0.5% -1:200000 IJ SOLN
INTRAMUSCULAR | Status: AC
  Filled 2017-10-16: qty 30

## 2017-10-16 MED ORDER — ONDANSETRON HCL 4 MG/2ML IJ SOLN
4.0000 mg | Freq: Once | INTRAMUSCULAR | Status: AC | PRN
  Administered 2017-10-16: 4 mg via INTRAVENOUS

## 2017-10-16 MED ORDER — FAMOTIDINE 20 MG PO TABS
20.0000 mg | ORAL_TABLET | Freq: Once | ORAL | Status: AC
  Administered 2017-10-16: 20 mg via ORAL

## 2017-10-16 MED ORDER — SUGAMMADEX SODIUM 200 MG/2ML IV SOLN
INTRAVENOUS | Status: DC | PRN
  Administered 2017-10-16: 200 mg via INTRAVENOUS

## 2017-10-16 MED ORDER — FENTANYL CITRATE (PF) 100 MCG/2ML IJ SOLN
INTRAMUSCULAR | Status: AC
  Filled 2017-10-16: qty 2

## 2017-10-16 MED ORDER — LACTATED RINGERS IV SOLN
INTRAVENOUS | Status: DC
  Administered 2017-10-16: 14:00:00 via INTRAVENOUS

## 2017-10-16 MED ORDER — CLINDAMYCIN PHOSPHATE 600 MG/50ML IV SOLN
INTRAVENOUS | Status: AC
  Filled 2017-10-16: qty 50

## 2017-10-16 MED ORDER — ONDANSETRON HCL 4 MG/2ML IJ SOLN
INTRAMUSCULAR | Status: AC
  Filled 2017-10-16: qty 2

## 2017-10-16 MED ORDER — PROPOFOL 10 MG/ML IV BOLUS
INTRAVENOUS | Status: DC | PRN
  Administered 2017-10-16: 160 mg via INTRAVENOUS

## 2017-10-16 MED ORDER — ROPIVACAINE HCL 5 MG/ML IJ SOLN
INTRAMUSCULAR | Status: AC
  Filled 2017-10-16: qty 30

## 2017-10-16 MED ORDER — LIDOCAINE HCL (CARDIAC) 20 MG/ML IV SOLN
INTRAVENOUS | Status: DC | PRN
  Administered 2017-10-16: 60 mg via INTRAVENOUS

## 2017-10-16 MED ORDER — PROPOFOL 10 MG/ML IV BOLUS
INTRAVENOUS | Status: AC
  Filled 2017-10-16: qty 20

## 2017-10-16 MED ORDER — FAMOTIDINE 20 MG PO TABS
ORAL_TABLET | ORAL | Status: AC
  Filled 2017-10-16: qty 1

## 2017-10-16 MED ORDER — MORPHINE SULFATE (PF) 4 MG/ML IV SOLN
INTRAVENOUS | Status: AC
  Filled 2017-10-16: qty 1

## 2017-10-16 MED ORDER — EPINEPHRINE PF 1 MG/ML IJ SOLN
INTRAMUSCULAR | Status: AC
  Filled 2017-10-16: qty 2

## 2017-10-16 MED ORDER — ROCURONIUM BROMIDE 100 MG/10ML IV SOLN
INTRAVENOUS | Status: DC | PRN
  Administered 2017-10-16: 50 mg via INTRAVENOUS

## 2017-10-16 MED ORDER — CEFAZOLIN SODIUM-DEXTROSE 2-4 GM/100ML-% IV SOLN
2.0000 g | INTRAVENOUS | Status: AC
  Administered 2017-10-16: 2 g via INTRAVENOUS

## 2017-10-16 MED ORDER — ROCURONIUM BROMIDE 50 MG/5ML IV SOLN
INTRAVENOUS | Status: AC
  Filled 2017-10-16: qty 1

## 2017-10-16 MED ORDER — PHENYLEPHRINE HCL 10 MG/ML IJ SOLN
INTRAMUSCULAR | Status: DC | PRN
  Administered 2017-10-16: 30 ug/min via INTRAVENOUS

## 2017-10-16 MED ORDER — MIDAZOLAM HCL 2 MG/2ML IJ SOLN
1.5000 mg | Freq: Once | INTRAMUSCULAR | Status: AC
  Administered 2017-10-16: 1.5 mg via INTRAVENOUS

## 2017-10-16 MED ORDER — HYDROCODONE-ACETAMINOPHEN 5-325 MG PO TABS: 1.0000 | ORAL_TABLET | Freq: Four times a day (QID) | ORAL | 0 refills | Status: AC | PRN

## 2017-10-16 MED ORDER — BUPIVACAINE HCL (PF) 0.25 % IJ SOLN
INTRAMUSCULAR | Status: AC
  Filled 2017-10-16: qty 60

## 2017-10-16 MED ORDER — MIDAZOLAM HCL 2 MG/2ML IJ SOLN
INTRAMUSCULAR | Status: AC
  Administered 2017-10-16: 1.5 mg via INTRAVENOUS
  Filled 2017-10-16: qty 2

## 2017-10-16 MED ORDER — MELOXICAM 15 MG PO TABS: 15.0000 mg | ORAL_TABLET | Freq: Every day | ORAL | 2 refills | Status: AC

## 2017-10-16 MED ORDER — FENTANYL CITRATE (PF) 100 MCG/2ML IJ SOLN
INTRAMUSCULAR | Status: DC | PRN
  Administered 2017-10-16 (×2): 50 ug via INTRAVENOUS

## 2017-10-16 MED ORDER — PHENYLEPHRINE HCL 10 MG/ML IJ SOLN
INTRAMUSCULAR | Status: DC | PRN
  Administered 2017-10-16 (×3): 200 ug via INTRAVENOUS
  Administered 2017-10-16 (×3): 100 ug via INTRAVENOUS

## 2017-10-16 MED ORDER — DEXAMETHASONE SODIUM PHOSPHATE 10 MG/ML IJ SOLN
INTRAMUSCULAR | Status: AC
  Filled 2017-10-16: qty 1

## 2017-10-16 MED ORDER — ONDANSETRON HCL 4 MG/2ML IJ SOLN
INTRAMUSCULAR | Status: DC | PRN
  Administered 2017-10-16: 4 mg via INTRAVENOUS

## 2017-10-16 MED ORDER — LIDOCAINE HCL (PF) 2 % IJ SOLN
INTRAMUSCULAR | Status: AC
  Filled 2017-10-16: qty 10

## 2017-10-16 MED ORDER — SUGAMMADEX SODIUM 200 MG/2ML IV SOLN
INTRAVENOUS | Status: AC
  Filled 2017-10-16: qty 2

## 2017-10-16 MED ORDER — ROPIVACAINE HCL 5 MG/ML IJ SOLN
INTRAMUSCULAR | Status: DC | PRN
  Administered 2017-10-16: 30 mL via PERINEURAL

## 2017-10-16 MED ORDER — GABAPENTIN 400 MG PO CAPS: 400.0000 mg | ORAL_CAPSULE | Freq: Two times a day (BID) | ORAL | 3 refills | Status: AC

## 2017-10-16 MED ORDER — CEFAZOLIN SODIUM-DEXTROSE 2-4 GM/100ML-% IV SOLN
INTRAVENOUS | Status: AC
  Filled 2017-10-16: qty 100

## 2017-10-16 SURGICAL SUPPLY — 50 items
ADAPTER IRRIG TUBE 2 SPIKE SOL (ADAPTER) ×3 IMPLANT
BLADE AGGRESSIVE PLUS 4.0 (BLADE) ×3 IMPLANT
BUR AGGRESSIVE+ 5.5 (BURR) IMPLANT
BUR BR 5.5 12 FLUTE (BURR) ×3 IMPLANT
BUR RADIUS 4.0X18.5 (BURR) IMPLANT
BUR RADIUS 5.5 (BURR) IMPLANT
CANNULA 5.75X7 CRYSTAL CLEAR (CANNULA) IMPLANT
CANNULA 8.5X75 THRED (CANNULA) IMPLANT
CANNULA PARTIAL THREAD 2X7 (CANNULA) ×3 IMPLANT
CHLORAPREP W/TINT 26ML (MISCELLANEOUS) ×3 IMPLANT
CONNECTOR PERFECT PASSER (CONNECTOR) IMPLANT
COVER MAYO STAND STRL (DRAPES) ×3 IMPLANT
DRAPE IMP U-DRAPE 54X76 (DRAPES) ×6 IMPLANT
DRAPE SHEET LG 3/4 BI-LAMINATE (DRAPES) IMPLANT
DRAPE STERI 35X30 U-POUCH (DRAPES) ×3 IMPLANT
GAUZE PETRO XEROFOAM 1X8 (MISCELLANEOUS) ×3 IMPLANT
GAUZE SPONGE 4X4 12PLY STRL (GAUZE/BANDAGES/DRESSINGS) ×6 IMPLANT
GLOVE SURG ORTHO 8.0 STRL STRW (GLOVE) ×3 IMPLANT
GOWN STRL REUS W/TWL LRG LVL4 (GOWN DISPOSABLE) ×3 IMPLANT
IV LACTATED RINGER IRRG 3000ML (IV SOLUTION) ×12
IV LR IRRIG 3000ML ARTHROMATIC (IV SOLUTION) ×6 IMPLANT
KIT SHOULDER TRACTION (DRAPES) ×3 IMPLANT
KIT TURNOVER KIT A (KITS) ×3 IMPLANT
MANIFOLD NEPTUNE II (INSTRUMENTS) ×3 IMPLANT
MAT BLUE FLOOR 46X72 FLO (MISCELLANEOUS) ×3 IMPLANT
NDL SAFETY ECLIPSE 18X1.5 (NEEDLE) ×1 IMPLANT
NEEDLE HYPO 18GX1.5 SHARP (NEEDLE) ×2
NEEDLE SPNL 18GX3.5 QUINCKE PK (NEEDLE) ×3 IMPLANT
NS IRRIG 500ML POUR BTL (IV SOLUTION) ×3 IMPLANT
PACK ARTHROSCOPY SHOULDER (MISCELLANEOUS) ×3 IMPLANT
PASSER SUT CAPTURE FIRST (SUTURE) IMPLANT
SET TUBE SUCT SHAVER OUTFL 24K (TUBING) ×3 IMPLANT
SLING ULTRA II LG (MISCELLANEOUS) ×3 IMPLANT
SOL PREP PVP 2OZ (MISCELLANEOUS) ×3
SOLUTION PREP PVP 2OZ (MISCELLANEOUS) ×1 IMPLANT
SUT ETHILON 3 0 FSLX (SUTURE) IMPLANT
SUT PDS PLUS 0 (SUTURE)
SUT PDS PLUS AB 0 CT-2 (SUTURE) IMPLANT
SUT PERFECTPASSER WHITE CART (SUTURE) IMPLANT
SUT VIC AB 2-0 CT2 27 (SUTURE) IMPLANT
SUT VICRYL 3-0 27IN (SUTURE) IMPLANT
SUTURE MAGNUM WIRE 2X48 BLK (SUTURE) IMPLANT
SYR 20CC LL (SYRINGE) IMPLANT
SYR 30ML LL (SYRINGE) ×3 IMPLANT
SYR 50ML LL SCALE MARK (SYRINGE) ×3 IMPLANT
TUBING ARTHRO INFLOW-ONLY STRL (TUBING) ×3 IMPLANT
TUBING CONNECTING 10 (TUBING) ×2 IMPLANT
TUBING CONNECTING 10' (TUBING) ×1
WAND COBLATION FLOW 50 (SURGICAL WAND) IMPLANT
WAND HAND CNTRL MULTIVAC 90 (MISCELLANEOUS) ×3 IMPLANT

## 2017-10-16 NOTE — Anesthesia Procedure Notes (Signed)
Anesthesia Regional Block: Interscalene brachial plexus block   Pre-Anesthetic Checklist: ,, timeout performed, Correct Patient, Correct Site, Correct Laterality, Correct Procedure, Correct Position, site marked, Risks and benefits discussed,  Surgical consent,  Pre-op evaluation,  At surgeon's request and post-op pain management  Laterality: Left     Needles:  Injection technique: Single-shot  Needle Type: Echogenic Stimulator Needle     Needle Length: 10cm  Needle Gauge: 22     Additional Needles:   Procedures:,,,, ultrasound used (permanent image in chart),,,,  Narrative:  Injection made incrementally with aspirations every 5 mL.  Performed by: Personally  Anesthesiologist: Yevette EdwardsAdams, James G, MD  Additional Notes: Functioning IV was confirmed and monitors were applied.Sterile prep and drape,hand hygiene and sterile gloves were used. Easy placement under US with picture to chart.  No pain on injection.   Negative aspiration and negative test dose prior to incremental administration of local anesthetic. The patient tolerated the procedure well.

## 2017-10-16 NOTE — Discharge Instructions (Signed)
Interscalene Nerve Block (ISNB) Discharge Instructions   1.  For your surgery you have received an Interscalene Nerve Block. 2. Nerve Blocks affect many types of nerves, including nerves that control movement, pain and normal sensation.  You may experience feelings such as numbness, tingling, heaviness, weakness or the inability to move your arm or the feeling or sensation that your arm has "fallen asleep". 3. A nerve block can last for 2 - 36 hours or more depending on the medication used.  Usually the weakness wears off first.  The tingling and heaviness usually wear off next.  Finally you may start to notice pain.  Keep in mind that this may occur in any order.  once a nerve block starts to wear off it is usually completely gone within 60 minutes. 4. ISNB may cause mild shortness of breath, a hoarse voice, blurry vision, unequal pupils, or drooping of the face on the same side as the nerve block.  These symptoms will usually go away within 12 hours.  Very rarely the procedure itself can cause mild seizures. 5. If needed, your surgeon will give you a prescription for pain medication.  It will take about 60 minutes for the oral pain medication to become fully effective.  So, it is recommended that you start taking this medication before the nerve block first begins to wear off, or when you first begin to feel discomfort. 6. Keep in mind that nerve blocks often wear off in the middle of the night.   If you are going to bed and the block has not started to wear off or you have not started to have any discomfort, consider setting an alarm for 2 to 3 hours, so you can assess your block.  If you notice the block is wearing off or you are starting to have discomfort, you can take your pain medication. 7. Take your pain medication only as prescribed.  Pain medication can cause sedation and decrease your breathing if you take more than you need for the level of pain that you have. 8. Nausea is a common side effect  of many pain medications.  You may want to eat something before taking your pain medicine to prevent nausea. 9. After an Interscalene nerve block, you cannot feel pain, pressure or extremes in temperature in the effected arm.  Because your arm is numb it is at an increased risk for injury.  To decrease the possibility of injury, please practice the following:  a. While you are awake change the position of your arm frequently to prevent too much pressure on any one area for prolonged periods of time. b.  If you have a cast or tight dressing, check the color or your fingers every couple of hours.  Call your surgeon with the appearance of any discoloration (white or blue). c. If you are given a sling to wear before you go home, please wear it  at all times until the block has completely worn off.  Do not get up at night without your sling. d. If you experience any problems or concerns, please contact your surgeon's office. e. If you experience severe or prolonged shortness of breath go to the nearest emergency department.

## 2017-10-16 NOTE — H&P (Signed)
THE PATIENT WAS SEEN PRIOR TO SURGERY TODAY.  HISTORY, ALLERGIES, HOME MEDICATIONS AND OPERATIVE PROCEDURE WERE REVIEWED. RISKS AND BENEFITS OF SURGERY DISCUSSED WITH PATIENT AGAIN.  NO CHANGES FROM INITIAL HISTORY AND PHYSICAL NOTED.    

## 2017-10-16 NOTE — Op Note (Signed)
10/16/2017  3:37 PM  PATIENT:  Aaron Hutchinson    PRE-OPERATIVE DIAGNOSIS:  M75.42 Impingement syndrome of left shoulder  POST-OPERATIVE DIAGNOSIS:  Same  PROCEDURE:  SHOULDER ARTHROSCOPY WITH SUBACROMIAL DECOMPRESSION  SURGEON:  Valinda HoarMILLER,Izaih Kataoka E, MD  ANESTHESIA:   General  PREOPERATIVE INDICATIONS:  Aaron GillsBernice G Kowalczyk is a  67 y.o. male with a diagnosis of M75.42 Impingement syndrome of left shoulder who failed conservative measures and elected for surgical management.    The risks benefits and alternatives were discussed with the patient preoperatively including but not limited to the risks of infection, bleeding, nerve injury, cardiopulmonary complications, the need for revision surgery, among others, and the patient was willing to proceed.  EBL: Minimal   OPERATIVE FINDINGS: Severe fraying of the biceps tendon and anterior labrum did delamination of the undersurface of the supraspinatus tendon some fraying of the distal subscapularis.  Glenohumeral articulation was normal there was significant bursitis in the subacromial region is significant anterior spurring and AC spurring.  The rotator cuff was intact on the dorsal surface  OPERATIVE PROCEDURE: The patient was brought to the operating room where satisfactory general endotracheal and interscalene anesthesia were accomplished.The patient was turned into the lateral decubitus position and the shoulder was prepped and draped in a sterile fashion. Arthroscopy was carried out from a posterior portal with accessory portals laterally and anteriorly. The  joint was examined first. The above findings were encountered. The motorized shaver was introduced anteriorly and the undersurface of the rotator cuff probed and lightly debrided. The biceps tendon was tenotomized. The labrum was trimmed up with the ArthroCare wand. The arthroscope was redirected into the subacromial space. There was severe bursitis which was resected with the motorized shaver and  ArthroCare wand. The large bur was introduced from a posterior portal and the anterior acromion was debrided. The undersurface of the clavicle was debrided with the bur which was then reintroduced from an anterior portal and the remaining distal clavicle completely excised.   Final debridement was carried out with the motorized shaver and the ArthroCare wand. The joint was flushed and the stab wounds and closed with 3-0 nylon suture. 1/4% Marcaine with morphine was injected into the shoulder. Sponge and needle counts were correct.   The dry sterile dressing and was applied along with a  padded sling. Patient was awakened and taken recovery in good condition.  Valinda HoarHoward E Ollivander See, MD

## 2017-10-16 NOTE — Transfer of Care (Signed)
Immediate Anesthesia Transfer of Care Note  Patient: Aaron Hutchinson  Procedure(s) Performed: Procedure(s): SHOULDER ARTHROSCOPY WITH SUBACROMIAL DECOMPRESSION (Left)  Patient Location: PACU  Anesthesia Type:General  Level of Consciousness: sedated  Airway & Oxygen Therapy: Patient Spontanous Breathing and Patient connected to face mask oxygen  Post-op Assessment: Report given to RN and Post -op Vital signs reviewed and stable  Post vital signs: Reviewed and stable  Last Vitals:  Vitals:   10/16/17 1344 10/16/17 1548  BP: (!) 108/97 132/80  Pulse: 65 (!) 109  Resp: (!) 25 (!) 22  Temp:  (!) 36.2 C  SpO2: 94% 100%    Complications: No apparent anesthesia complications

## 2017-10-16 NOTE — Anesthesia Procedure Notes (Signed)
Procedure Name: Intubation Date/Time: 10/16/2017 1:55 PM Performed by: Eben Burow, CRNA Pre-anesthesia Checklist: Patient identified, Emergency Drugs available, Suction available, Patient being monitored and Timeout performed Patient Re-evaluated:Patient Re-evaluated prior to induction Oxygen Delivery Method: Circle system utilized Preoxygenation: Pre-oxygenation with 100% oxygen Induction Type: IV induction Ventilation: Mask ventilation without difficulty Laryngoscope Size: Mac and 4 Grade View: Grade I Tube type: Oral Tube size: 7.5 mm Number of attempts: 1 Airway Equipment and Method: Stylet Placement Confirmation: ETT inserted through vocal cords under direct vision,  positive ETCO2 and breath sounds checked- equal and bilateral Secured at: 23 cm Tube secured with: Tape Dental Injury: Teeth and Oropharynx as per pre-operative assessment

## 2017-10-16 NOTE — OR Nursing (Signed)
Discussed discharge instructions with pt and wife. Both voice understanding. 

## 2017-10-16 NOTE — Anesthesia Post-op Follow-up Note (Signed)
Anesthesia QCDR form completed.        

## 2017-10-16 NOTE — Anesthesia Procedure Notes (Signed)
Anesthesia Regional Block: Interscalene brachial plexus block   Pre-Anesthetic Checklist: ,, timeout performed, Correct Patient, Correct Site, Correct Laterality, Correct Procedure, Correct Position, site marked, Risks and benefits discussed,  Surgical consent,  Pre-op evaluation,  At surgeon's request and post-op pain management  Laterality: Left  Prep: chloraprep       Needles:  Injection technique: Single-shot  Needle Type: Echogenic Stimulator Needle     Needle Length: 10cm  Needle Gauge: 22     Additional Needles:   Procedures:, nerve stimulator,,, ultrasound used (permanent image in chart),,,,   Nerve Stimulator or Paresthesia:  Response: biceps flexion,   Additional Responses:   Narrative:  Injection made incrementally with aspirations every 5 mL.  Performed by: Personally   Additional Notes: Functioning IV was confirmed and monitors were applied.  A 50mm 22ga Stimuplex needle was used. Sterile prep and drape,hand hygiene and sterile gloves were used.  Negative aspiration and negative test dose prior to incremental administration of local anesthetic. The patient tolerated the procedure well.

## 2017-10-16 NOTE — Anesthesia Preprocedure Evaluation (Addendum)
Anesthesia Evaluation  Patient identified by MRN, date of birth, ID band Patient awake    Reviewed: Allergy & Precautions, H&P , NPO status , Patient's Chart, lab work & pertinent test results, reviewed documented beta blocker date and time   History of Anesthesia Complications (+) PONV and history of anesthetic complications  Airway Mallampati: II  TM Distance: >3 FB Neck ROM: full    Dental  (+) Teeth Intact   Pulmonary neg pulmonary ROS, Current Smoker,    Pulmonary exam normal        Cardiovascular Exercise Tolerance: Good negative cardio ROS Normal cardiovascular exam Rhythm:regular Rate:Normal     Neuro/Psych negative neurological ROS  negative psych ROS   GI/Hepatic negative GI ROS, Neg liver ROS, GERD  ,  Endo/Other  negative endocrine ROS  Renal/GU negative Renal ROS  negative genitourinary   Musculoskeletal  (+) Arthritis ,   Abdominal   Peds  Hematology negative hematology ROS (+)   Anesthesia Other Findings Past Medical History: No date: Arthritis No date: Chronic prostatitis No date: GERD (gastroesophageal reflux disease) No date: History of kidney stones No date: PONV (postoperative nausea and vomiting) Past Surgical History: 10/08/2015: APPENDECTOMY 03/2014: CATARACT EXTRACTION W/ INTRAOCULAR LENS  IMPLANT, BILATERAL;  Bilateral 08/06/2014: CHOLECYSTECTOMY No date: COLONOSCOPY No date: EYE SURGERY No date: HERNIA REPAIR; Right     Comment:  inguinal No date: KNEE ARTHROSCOPY; Bilateral No date: NOSE SURGERY     Comment:  fracture repair BMI    Body Mass Index:  27.41 kg/m     Reproductive/Obstetrics negative OB ROS                            Anesthesia Physical Anesthesia Plan  ASA: II  Anesthesia Plan: General ETT   Post-op Pain Management:  Regional for Post-op pain   Induction:   PONV Risk Score and Plan: 3  Airway Management Planned:    Additional Equipment:   Intra-op Plan:   Post-operative Plan:   Informed Consent: I have reviewed the patients History and Physical, chart, labs and discussed the procedure including the risks, benefits and alternatives for the proposed anesthesia with the patient or authorized representative who has indicated his/her understanding and acceptance.   Dental Advisory Given  Plan Discussed with: CRNA  Anesthesia Plan Comments:        Anesthesia Quick Evaluation

## 2017-10-16 NOTE — Anesthesia Postprocedure Evaluation (Signed)
Anesthesia Post Note  Patient: Aaron Hutchinson  Procedure(s) Performed: SHOULDER ARTHROSCOPY WITH SUBACROMIAL DECOMPRESSION (Left )  Patient location during evaluation: PACU Anesthesia Type: General Level of consciousness: awake and alert Pain management: pain level controlled Vital Signs Assessment: post-procedure vital signs reviewed and stable Respiratory status: spontaneous breathing, nonlabored ventilation, respiratory function stable and patient connected to nasal cannula oxygen Cardiovascular status: blood pressure returned to baseline and stable Postop Assessment: no apparent nausea or vomiting Anesthetic complications: no     Last Vitals:  Vitals:   10/16/17 1644 10/16/17 1645  BP:  125/80  Pulse: 70 74  Resp: 18 16  Temp: 36.7 C   SpO2: 93% 95%    Last Pain:  Vitals:   10/16/17 1644  TempSrc:   PainSc: 0-No pain                 Cordie Beazley S

## 2017-10-17 ENCOUNTER — Encounter: Payer: Self-pay | Admitting: Specialist

## 2017-11-29 ENCOUNTER — Other Ambulatory Visit: Payer: Self-pay | Admitting: Specialist

## 2017-11-29 DIAGNOSIS — M7542 Impingement syndrome of left shoulder: Secondary | ICD-10-CM

## 2017-12-13 ENCOUNTER — Ambulatory Visit
Admission: RE | Admit: 2017-12-13 | Discharge: 2017-12-13 | Disposition: A | Discharge status: Home / Self Care | Payer: BLUE CROSS/BLUE SHIELD | Source: Ambulatory Visit | Attending: Specialist | Admitting: Specialist

## 2017-12-13 DIAGNOSIS — M7542 Impingement syndrome of left shoulder: Secondary | ICD-10-CM | POA: Diagnosis present

## 2017-12-13 DIAGNOSIS — S46012A Strain of muscle(s) and tendon(s) of the rotator cuff of left shoulder, initial encounter: Secondary | ICD-10-CM | POA: Diagnosis not present

## 2017-12-13 DIAGNOSIS — M25412 Effusion, left shoulder: Secondary | ICD-10-CM | POA: Insufficient documentation

## 2017-12-13 DIAGNOSIS — X58XXXA Exposure to other specified factors, initial encounter: Secondary | ICD-10-CM | POA: Diagnosis not present

## 2022-07-26 ENCOUNTER — Other Ambulatory Visit: Payer: Self-pay | Admitting: *Deleted

## 2022-07-26 DIAGNOSIS — Z122 Encounter for screening for malignant neoplasm of respiratory organs: Secondary | ICD-10-CM

## 2022-07-26 DIAGNOSIS — Z87891 Personal history of nicotine dependence: Secondary | ICD-10-CM

## 2022-07-26 DIAGNOSIS — F17210 Nicotine dependence, cigarettes, uncomplicated: Secondary | ICD-10-CM

## 2022-09-12 NOTE — Progress Notes (Unsigned)
Virtual Visit via Telephone Note  I connected with Karel Jarvis on 09/12/22 at  2:00 PM EST by telephone and verified that I am speaking with the correct person using two identifiers.  Location: Patient:  At home  Provider:  [place]   I discussed the limitations, risks, security and privacy concerns of performing an evaluation and management service by telephone and the availability of in person appointments. I also discussed with the patient that there may be a patient responsible charge related to this service. The patient expressed understanding and agreed to proceed.   Shared Decision Making Visit Lung Cancer Screening Program [phone])   Eligibility: Age 72 y.o. Pack Years Smoking History Calculation 51 pack year smoking history (# packs/per year x # years smoked) Recent History of coughing up blood  no Unexplained weight loss? no ( >Than 15 pounds within the last 6 months ) Prior History Lung / other cancer no (Diagnosis within the last 5 years already requiring surveillance chest CT Scans). Smoking Status Current Smoker Former Smokers: Years since quit:  NA  Quit Date:  NA  Visit Components: Discussion included one or more decision making aids. yes Discussion included risk/benefits of screening. yes Discussion included potential follow up diagnostic testing for abnormal scans. yes Discussion included meaning and risk of over diagnosis. yes Discussion included meaning and risk of False Positives. yes Discussion included meaning of total radiation exposure. yes  Counseling Included: Importance of adherence to annual lung cancer LDCT screening. yes Impact of comorbidities on ability to participate in the program. yes Ability and willingness to under diagnostic treatment. yes  Smoking Cessation Counseling: Current Smokers:  Discussed importance of smoking cessation. yes Information about tobacco cessation classes and interventions provided to  patient. yes Patient provided with "ticket" for LDCT Scan. yes Symptomatic Patient. no  Counseling NA Diagnosis Code: Tobacco Use Z72.0 Asymptomatic Patient yes  Counseling (Intermediate counseling: > three minutes counseling) [id number] Former Smokers:  Discussed the importance of maintaining cigarette abstinence. yes Diagnosis Code: Personal History of Nicotine Dependence. J00.938 Information about tobacco cessation classes and interventions provided to patient. Yes Patient provided with "ticket" for LDCT Scan. yes Written Order for Lung Cancer Screening with LDCT placed in Epic. Yes (CT Chest Lung Cancer Screening Low Dose W/O CM) [id number] Z12.2-Screening of respiratory organs Z87.891-Personal history of nicotine dependence  I have spent 25 minutes of face to face/ virtual visit   time with  Mr. Nawaz discussing the risks and benefits of lung cancer screening. We viewed / discussed a power point together that explained in detail the above noted topics. We paused at intervals to allow for questions to be asked and answered to ensure understanding.We discussed that the single most powerful action that he can take to decrease his risk of developing lung cancer is to quit smoking. We discussed whether or not he is ready to commit to setting a quit date. We discussed options for tools to aid in quitting smoking including nicotine replacement therapy, non-nicotine medications, support groups, Quit Smart classes, and behavior modification. We discussed that often times setting smaller, more achievable goals, such as eliminating 1 cigarette a day for a week and then 2 cigarettes a day for a week can be helpful in slowly decreasing the number of cigarettes smoked. This allows for a sense of accomplishment as well as providing a clinical benefit. I provided  him  with smoking cessation  information  with contact information for community resources, classes, free nicotine replacement  therapy, and access to mobile  apps, text messaging, and on-line smoking cessation help. I have also provided  him  the office contact information in the event he needs to contact me, or the screening staff. We discussed the time and location of the scan, and that either Doroteo Glassman RN, Joella Prince, RN  or I will call / send a letter with the results within 24-72 hours of receiving them. The patient verbalized understanding of all of  the above and had no further questions upon leaving the office. They have my contact information in the event they have any further questions.  I spent 3 minutes counseling on smoking cessation and the health risks of continued tobacco abuse.  I explained to the patient that there has been a high incidence of coronary artery disease noted on these exams. I explained that this is a non-gated exam therefore degree or severity cannot be determined. This patient is on statin therapy. I have asked the patient to follow-up with their PCP regarding any incidental finding of coronary artery disease and management with diet or medication as their PCP  feels is clinically indicated. The patient verbalized understanding of the above and had no further questions upon completion of the visit.      Magdalen Spatz, NP 09/13/2022

## 2022-09-13 ENCOUNTER — Encounter: Payer: Self-pay | Admitting: Acute Care

## 2022-09-13 ENCOUNTER — Ambulatory Visit: Payer: Medicare Other | Admitting: Acute Care

## 2022-09-13 DIAGNOSIS — F17210 Nicotine dependence, cigarettes, uncomplicated: Secondary | ICD-10-CM | POA: Diagnosis not present

## 2022-09-13 NOTE — Patient Instructions (Signed)
Thank you for participating in the [facility] Screening [facility]. It was our pleasure to meet you today. We will call you with the results of your scan within the next few days. Your scan will be assigned a Lung RADS category score by the physicians reading the scans.  This Lung RADS score determines follow up scanning.  See below for description of categories, and follow up screening recommendations. We will be in touch to schedule your follow up screening annually or based on recommendations of our providers. We will fax a copy of your scan results to your Primary Care Physician, or the physician who referred you to the program, to ensure they have the results. Please call the office if you have any questions or concerns regarding your scanning experience or results.  Our office number is [phone]. Please speak with Denise Phelps, RN. , or  Denise Buckner RN, They are  our Lung Cancer Screening RN.'s If They are unavailable when you call, Please leave a message on the voice mail. We will return your call at our earliest convenience.This voice mail is monitored several times a day.  Remember, if your scan is normal, we will scan you annually as long as you continue to meet the criteria for the program. (Age 55-77, Current smoker or smoker who has quit within the last 15 years). If you are a smoker, remember, quitting is the single most powerful action that you can take to decrease your risk of lung cancer and other pulmonary, breathing related problems. We know quitting is hard, and we are here to help.  Please let us know if there is anything we can do to help you meet your goal of quitting. If you are a former smoker, congratulations. We are proud of you! Remain smoke free! Remember you can refer friends or family members through the number above.  We will screen them to make sure they meet criteria for the program. Thank you for helping us take better care of you by  participating in Lung Screening.  You can receive free nicotine replacement therapy ( patches, gum or mints) by calling 1-800-QUIT NOW. Please call so we can get you on the path to becoming  a non-smoker. I know it is hard, but you can do this!  Lung RADS Categories:  Lung RADS 1: no nodules or definitely non-concerning nodules.  Recommendation is for a repeat annual scan in 12 months.  Lung RADS 2:  nodules that are non-concerning in appearance and behavior with a very low likelihood of becoming an active cancer. Recommendation is for a repeat annual scan in 12 months.  Lung RADS 3: nodules that are probably non-concerning , includes nodules with a low likelihood of becoming an active cancer.  Recommendation is for a 6-month repeat screening scan. Often noted after an upper respiratory illness. We will be in touch to make sure you have no questions, and to schedule your 6-month scan.  Lung RADS 4 A: nodules with concerning findings, recommendation is most often for a follow up scan in 3 months or additional testing based on our provider's assessment of the scan. We will be in touch to make sure you have no questions and to schedule the recommended 3 month follow up scan.  Lung RADS 4 B:  indicates findings that are concerning. We will be in touch with you to schedule additional diagnostic testing based on our provider's  assessment of the scan.  Other options for assistance in smoking cessation (   As covered by your insurance benefits)  Hypnosis for smoking cessation  [employer] [phone]  Acupuncture for smoking cessation  [facility] [phone]   

## 2022-09-14 ENCOUNTER — Ambulatory Visit
Admission: RE | Admit: 2022-09-14 | Discharge: 2022-09-14 | Disposition: A | Discharge status: Home / Self Care | Payer: Medicare Other | Source: Ambulatory Visit | Attending: Acute Care | Admitting: Acute Care

## 2022-09-14 DIAGNOSIS — Z87891 Personal history of nicotine dependence: Secondary | ICD-10-CM

## 2022-09-14 DIAGNOSIS — Z122 Encounter for screening for malignant neoplasm of respiratory organs: Secondary | ICD-10-CM | POA: Diagnosis present

## 2022-09-14 DIAGNOSIS — F17210 Nicotine dependence, cigarettes, uncomplicated: Secondary | ICD-10-CM | POA: Insufficient documentation

## 2022-09-27 ENCOUNTER — Telehealth: Payer: Self-pay | Admitting: *Deleted

## 2022-09-27 ENCOUNTER — Telehealth: Payer: Self-pay | Admitting: Acute Care

## 2022-09-27 NOTE — Telephone Encounter (Signed)
Pt calling requesting lung screening CT results.    IMPRESSION: 1. Lung-RADS 2S, benign appearance or behavior. Continue annual screening with low-dose chest CT without contrast in 12 months. S modifier for coronary artery calcifications and ILD. 2. Severe three-vessel coronary artery calcifications, recommend ASCVD risk assessment. 3. Lower lung predominant reticular and ground-glass opacities with associated traction bronchiectasis, findings can be seen in setting fibrotic interstitial lung disease. Consider pulmonary function tests and/or further evaluation with ILD protocol CT. 4. Aortic Atherosclerosis (ICD10-I70.0) and Emphysema (ICD10-J43.9).

## 2022-09-27 NOTE — Telephone Encounter (Signed)
I have called the patient with the results of the low dose CT Chest. I explained his scan was read as a Lung RADS 2: nodules that are benign in appearance and behavior with a very low likelihood of becoming a clinically active cancer due to size or lack of growth. Recommendation per radiology is for a repeat LDCT in 12 months.  We will order and schedule a 12 month follow up scan due 08/2023. There was notation of Severe three-vessel coronary artery calcifications,  and Lower lung predominant reticular and ground-glass opacities with associated traction bronchiectasis, findings can be seen in setting fibrotic interstitial lung disease. Consider pulmonary function tests and/or further evaluation with ILD protocol CT. I have faxed the results to patient's PCP with a cover letter requesting he refer to cardiology and pulmonary for further evaluation. I did this as I felt he would prefer to refer to his [facility] referrals. I have asked the patient to call his PCP's  office if he does not hear from them this week. He verbalized understanding and stated he would call the office this week if he has not heard from them.He also has our number , and can call here if he would like [place] to make [facility] [place] referral.

## 2022-09-28 ENCOUNTER — Other Ambulatory Visit: Payer: Self-pay

## 2022-09-28 DIAGNOSIS — Z87891 Personal history of nicotine dependence: Secondary | ICD-10-CM

## 2022-09-28 DIAGNOSIS — F17210 Nicotine dependence, cigarettes, uncomplicated: Secondary | ICD-10-CM

## 2022-09-28 DIAGNOSIS — Z122 Encounter for screening for malignant neoplasm of respiratory organs: Secondary | ICD-10-CM

## 2022-09-28 NOTE — Telephone Encounter (Signed)
Order placed for LDCT due Jan 2025

## 2023-04-12 ENCOUNTER — Other Ambulatory Visit: Payer: Self-pay | Admitting: Family Medicine

## 2023-04-12 ENCOUNTER — Encounter: Payer: Self-pay | Admitting: Family Medicine

## 2023-04-12 DIAGNOSIS — R160 Hepatomegaly, not elsewhere classified: Secondary | ICD-10-CM

## 2023-04-16 ENCOUNTER — Ambulatory Visit
Admission: RE | Admit: 2023-04-16 | Discharge: 2023-04-16 | Disposition: A | Discharge status: Home / Self Care | Payer: Medicare Other | Source: Ambulatory Visit | Attending: Family Medicine | Admitting: Family Medicine

## 2023-04-16 DIAGNOSIS — R160 Hepatomegaly, not elsewhere classified: Secondary | ICD-10-CM | POA: Diagnosis not present

## 2023-04-16 MED ORDER — GADOBUTROL 1 MMOL/ML IV SOLN
7.5000 mL | Freq: Once | INTRAVENOUS | Status: AC | PRN
  Administered 2023-04-16: 7.5 mL via INTRAVENOUS

## 2023-05-17 ENCOUNTER — Ambulatory Visit: Payer: Medicare Other

## 2023-05-17 DIAGNOSIS — K641 Second degree hemorrhoids: Secondary | ICD-10-CM | POA: Diagnosis not present

## 2023-05-17 DIAGNOSIS — D128 Benign neoplasm of rectum: Secondary | ICD-10-CM | POA: Diagnosis not present

## 2023-05-17 DIAGNOSIS — D127 Benign neoplasm of rectosigmoid junction: Secondary | ICD-10-CM | POA: Diagnosis not present

## 2023-05-17 DIAGNOSIS — Z09 Encounter for follow-up examination after completed treatment for conditions other than malignant neoplasm: Secondary | ICD-10-CM | POA: Diagnosis not present

## 2023-05-17 DIAGNOSIS — Z86010 Personal history of colonic polyps: Secondary | ICD-10-CM | POA: Diagnosis not present

## 2023-05-17 DIAGNOSIS — R197 Diarrhea, unspecified: Secondary | ICD-10-CM | POA: Diagnosis not present

## 2023-09-19 ENCOUNTER — Ambulatory Visit
Admission: RE | Admit: 2023-09-19 | Discharge: 2023-09-19 | Disposition: A | Discharge status: Home / Self Care | Payer: Medicare Other | Source: Ambulatory Visit | Attending: Acute Care | Admitting: Acute Care

## 2023-09-19 DIAGNOSIS — F17210 Nicotine dependence, cigarettes, uncomplicated: Secondary | ICD-10-CM | POA: Insufficient documentation

## 2023-09-19 DIAGNOSIS — Z122 Encounter for screening for malignant neoplasm of respiratory organs: Secondary | ICD-10-CM | POA: Diagnosis present

## 2023-09-19 DIAGNOSIS — Z87891 Personal history of nicotine dependence: Secondary | ICD-10-CM | POA: Diagnosis present

## 2023-09-27 ENCOUNTER — Other Ambulatory Visit: Payer: Self-pay

## 2023-09-27 DIAGNOSIS — Z122 Encounter for screening for malignant neoplasm of respiratory organs: Secondary | ICD-10-CM

## 2023-09-27 DIAGNOSIS — F17210 Nicotine dependence, cigarettes, uncomplicated: Secondary | ICD-10-CM

## 2023-09-27 DIAGNOSIS — Z87891 Personal history of nicotine dependence: Secondary | ICD-10-CM

## 2024-04-11 ENCOUNTER — Other Ambulatory Visit: Payer: Self-pay | Admitting: Gastroenterology

## 2024-04-11 DIAGNOSIS — R933 Abnormal findings on diagnostic imaging of other parts of digestive tract: Secondary | ICD-10-CM

## 2024-04-11 DIAGNOSIS — R195 Other fecal abnormalities: Secondary | ICD-10-CM

## 2024-04-23 ENCOUNTER — Ambulatory Visit
Admission: RE | Admit: 2024-04-23 | Discharge: 2024-04-23 | Disposition: A | Discharge status: Home / Self Care | Source: Ambulatory Visit | Attending: Gastroenterology | Admitting: Gastroenterology

## 2024-04-23 DIAGNOSIS — R933 Abnormal findings on diagnostic imaging of other parts of digestive tract: Secondary | ICD-10-CM | POA: Diagnosis present

## 2024-04-23 MED ORDER — IOHEXOL 300 MG/ML  SOLN
100.0000 mL | Freq: Once | INTRAMUSCULAR | Status: AC | PRN
  Administered 2024-04-23: 100 mL via INTRAVENOUS

## 2024-05-22 ENCOUNTER — Ambulatory Visit

## 2024-05-22 DIAGNOSIS — K641 Second degree hemorrhoids: Secondary | ICD-10-CM | POA: Diagnosis not present

## 2024-05-22 DIAGNOSIS — K5289 Other specified noninfective gastroenteritis and colitis: Secondary | ICD-10-CM | POA: Diagnosis present

## 2024-09-19 ENCOUNTER — Ambulatory Visit
Admission: RE | Admit: 2024-09-19 | Discharge: 2024-09-19 | Disposition: A | Discharge status: Home / Self Care | Source: Ambulatory Visit | Attending: Family Medicine | Admitting: Family Medicine

## 2024-09-19 DIAGNOSIS — Z87891 Personal history of nicotine dependence: Secondary | ICD-10-CM | POA: Insufficient documentation

## 2024-09-19 DIAGNOSIS — F17210 Nicotine dependence, cigarettes, uncomplicated: Secondary | ICD-10-CM | POA: Diagnosis present

## 2024-09-19 DIAGNOSIS — Z122 Encounter for screening for malignant neoplasm of respiratory organs: Secondary | ICD-10-CM | POA: Insufficient documentation

## 2024-09-24 ENCOUNTER — Telehealth: Payer: Self-pay | Admitting: *Deleted

## 2024-09-24 DIAGNOSIS — R911 Solitary pulmonary nodule: Secondary | ICD-10-CM

## 2024-09-24 DIAGNOSIS — Z87891 Personal history of nicotine dependence: Secondary | ICD-10-CM

## 2024-09-24 NOTE — Telephone Encounter (Signed)
 Spoke with Aaron Hutchinson and reviewed lung screening CT results. New small lung nodule noted. Recommendation is to repeat CT in 6 months. Aaron Hutchinson verbalized understanding and is aware we will call him closer to 6 months to schedule. There was also a notation of possible interstitial lung disease. Aaron Hutchinson sees Dr Aleskerov and will continue to follow up on this finding. Results/ plans sent to PCP and Dr Parris.
